# Patient Record
Sex: Male | Born: 1955 | Race: Black or African American | Hispanic: No | Marital: Married | Smoking: Never smoker
Health system: Southern US, Community
[De-identification: ages and names within clinical notes are randomized; demographics above are authoritative.]

## PROBLEM LIST (undated history)

## (undated) DIAGNOSIS — E785 Hyperlipidemia, unspecified: Secondary | ICD-10-CM

## (undated) DIAGNOSIS — I10 Essential (primary) hypertension: Secondary | ICD-10-CM

## (undated) DIAGNOSIS — K2100 Gastro-esophageal reflux disease with esophagitis, without bleeding: Secondary | ICD-10-CM

## (undated) DIAGNOSIS — R002 Palpitations: Secondary | ICD-10-CM

## (undated) DIAGNOSIS — H409 Unspecified glaucoma: Secondary | ICD-10-CM

## (undated) DIAGNOSIS — R0789 Other chest pain: Secondary | ICD-10-CM

## (undated) HISTORY — DX: Hyperlipidemia, unspecified: E78.5

## (undated) HISTORY — DX: Gastro-esophageal reflux disease with esophagitis: K21.0

## (undated) HISTORY — DX: Gastro-esophageal reflux disease with esophagitis, without bleeding: K21.00

## (undated) HISTORY — DX: Palpitations: R00.2

## (undated) HISTORY — DX: Other chest pain: R07.89

## (undated) HISTORY — DX: Unspecified glaucoma: H40.9

---

## 2000-09-28 ENCOUNTER — Observation Stay (HOSPITAL_COMMUNITY): Admission: EM | Admit: 2000-09-28 | Discharge: 2000-09-29 | Payer: Self-pay

## 2000-09-28 ENCOUNTER — Encounter: Payer: Self-pay | Admitting: Emergency Medicine

## 2001-08-24 ENCOUNTER — Encounter: Payer: Self-pay | Admitting: *Deleted

## 2001-08-24 ENCOUNTER — Emergency Department (HOSPITAL_COMMUNITY): Admission: EM | Admit: 2001-08-24 | Discharge: 2001-08-24 | Payer: Self-pay | Admitting: *Deleted

## 2004-08-05 ENCOUNTER — Emergency Department (HOSPITAL_COMMUNITY): Admission: EM | Admit: 2004-08-05 | Discharge: 2004-08-05 | Payer: Self-pay | Admitting: Emergency Medicine

## 2004-09-01 ENCOUNTER — Emergency Department (HOSPITAL_COMMUNITY): Admission: EM | Admit: 2004-09-01 | Discharge: 2004-09-01 | Payer: Self-pay | Admitting: Emergency Medicine

## 2010-01-29 ENCOUNTER — Ambulatory Visit: Payer: Self-pay | Admitting: Cardiology

## 2010-01-29 ENCOUNTER — Observation Stay (HOSPITAL_COMMUNITY)
Admission: EM | Admit: 2010-01-29 | Discharge: 2010-01-31 | Payer: Self-pay | Source: Home / Self Care | Admitting: Emergency Medicine

## 2010-01-30 ENCOUNTER — Encounter: Payer: Self-pay | Admitting: Cardiology

## 2010-01-31 ENCOUNTER — Encounter: Payer: Self-pay | Admitting: Adult Health

## 2010-02-07 ENCOUNTER — Telehealth (INDEPENDENT_AMBULATORY_CARE_PROVIDER_SITE_OTHER): Payer: Self-pay | Admitting: *Deleted

## 2010-02-08 ENCOUNTER — Ambulatory Visit: Payer: Self-pay | Admitting: Cardiovascular Disease

## 2010-02-08 ENCOUNTER — Encounter: Payer: Self-pay | Admitting: Adult Health

## 2010-02-08 DIAGNOSIS — I10 Essential (primary) hypertension: Secondary | ICD-10-CM | POA: Insufficient documentation

## 2010-02-08 DIAGNOSIS — R079 Chest pain, unspecified: Secondary | ICD-10-CM | POA: Insufficient documentation

## 2010-02-15 ENCOUNTER — Encounter (INDEPENDENT_AMBULATORY_CARE_PROVIDER_SITE_OTHER): Payer: Self-pay | Admitting: *Deleted

## 2010-02-15 ENCOUNTER — Ambulatory Visit: Payer: Self-pay | Admitting: Cardiology

## 2010-03-07 ENCOUNTER — Ambulatory Visit
Admission: RE | Admit: 2010-03-07 | Discharge: 2010-03-07 | Payer: Self-pay | Source: Home / Self Care | Attending: Cardiology | Admitting: Cardiology

## 2010-03-07 DIAGNOSIS — F528 Other sexual dysfunction not due to a substance or known physiological condition: Secondary | ICD-10-CM | POA: Insufficient documentation

## 2010-03-25 ENCOUNTER — Telehealth (INDEPENDENT_AMBULATORY_CARE_PROVIDER_SITE_OTHER): Payer: Self-pay

## 2010-04-02 NOTE — Assessment & Plan Note (Signed)
Summary: EPH   Visit Type:  Follow-up Primary [place] [facility]:  Dr.Mcinnis  CC:  post hospital.  History of Present Illness: Christopher Gallegos is a 55 y/o AAM who is here on follow-up after being seen on consultation in [facility] for chest pain and hypertension.  He was placed on lisinopril 20/12.5mg  daily, had an echocardiogram and placed on PPI on discharge. He has had some complaints of headache but no chest discomfort since discharge. Echo demonstrated normal LV fx of 55%-60% and grade 2 diastolic dysfx.  He is compliant with medications.    Current Medications (verified): 1)  Lisinopril-Hydrochlorothiazide 20-12.5 Mg Tabs (Lisinopril-Hydrochlorothiazide) .... Take 1 Tablet By Mouth Once A Day 2)  Pantoprazole Sodium 40 Mg Tbec (Pantoprazole Sodium) .... Take 1 Tab Daily  Allergies (verified): No Known Drug Allergies  Comments:  Nurse/Medical Assistant: patient is only on 2 meds   Past History:  Past medical, surgical, family and social histories (including risk factors) reviewed, and no changes noted (except as noted below).  Past Medical History: Reviewed history from 02/07/2010 and no changes required. atypical chest pain reflux esophagitis hyperlipidemia palpatations  Past Surgical History: Reviewed history from 02/07/2010 and no changes required. no surgical history noted  Family History: Reviewed history from 02/07/2010 and no changes required. Father:deceased due to car accident Mother:aliv has heart issues  Social History: Reviewed history from 02/07/2010 and no changes required. Full Time Married  Tobacco Use - No.  Alcohol Use - no Regular Exercise - no Drug Use - no  Review of Systems       Headaches  All other systems have been reviewed and are negative unless stated above.   Vital Signs:  Patient profile:   56 year old male Height:      65 inches Weight:      195 pounds BMI:     32.57 O2 Sat:      92 % on Room air Pulse rate:   75 / minute BP sitting:    157 / 85  (right arm)  Vitals Entered By: Dreama Saa, CNA (February 08, 2010 2:46 PM)  O2 Flow:  Room air  Physical Exam  General:  Well developed, well nourished, in no acute distress. Lungs:  Clear bilaterally to auscultation and percussion. Heart:  Non-displaced PMI, chest non-tender; regular rate and rhythm, S1, S2 with soft S4, Carotid upstroke normal, no bruit. Normal abdominal aortic size, no bruits. Femorals normal pulses, no bruits. Pedals normal pulses. No edema, no varicosities. Abdomen:  Bowel sounds positive; abdomen soft and non-tender without masses, organomegaly, or hernias noted. No hepatosplenomegaly. Msk:  Back normal, normal gait. Muscle strength and tone normal. Pulses:  pulses normal in all 4 extremities Extremities:  No clubbing or cyanosis. Neurologic:  Alert and oriented x 3. Psych:  Normal affect.   EKG  Procedure date:  02/08/2010  Findings:      Normal sinus rhythm with rate of:  73bpm  Impression & Recommendations:  Problem # 1:  CHEST PAIN UNSPECIFIED (ICD-786.50) He is without complaint at this time.  PPI has been very helpful with his symptoms.  Will consider stress myoview once BP is stable. His updated medication list for this problem includes:    Lisinopril-hydrochlorothiazide 20-12.5 Mg Tabs (Lisinopril-hydrochlorothiazide) .Marland Kitchen... Take 1 tablet by mouth once a day  Problem # 2:  HYPERTENSION (ICD-401.9) Much better controlled than on hospitalization 190's systolic.  I have asked him to get a BP cuff and record his readings at home.  Will probably  have to go up on dose of lisinopril/HCTZ if remains in the 150's systolic with Grade II distolic dysfx, to bring him down to desired level of 135/70.  He will come back in one week with these recordings.  I have given him instructions on low sodium diet. His updated medication list for this problem includes:    Lisinopril-hydrochlorothiazide 20-12.5 Mg Tabs (Lisinopril-hydrochlorothiazide) .Marland Kitchen... Take  1 tablet by mouth once a day  Orders: Durable Medical Equipment (DME)  Patient Instructions: 1)  Your physician recommends that you schedule a follow-up appointment in: 3 MONTHS 2)  Your physician recommends that you continue on your current medications as directed. Please refer to the Current Medication list given to you today. 3)  You have been referred to NURSE VISIT IN 1 WEEK, PLEASE BRING BP DIARY AND CUFF TO VISIT 4)  Your physician has requested that you limit the intake of sodium (salt) in your diet to two grams daily. Please see [facility] handout. 5)  Your physician has requested that you regularly monitor and record your blood pressure readings at home.  Please use the same machine at the same time of day to check your readings and record them to bring to your follow-up visit. 6)  BOOK RECOMMENDATION: EAT THIS NOT THAT Prescriptions: LISINOPRIL-HYDROCHLOROTHIAZIDE 20-12.5 MG TABS (LISINOPRIL-HYDROCHLOROTHIAZIDE) Take 1 tablet by mouth once a day  #30 x 3   Entered by:   Fuller Plan CMA   Authorized by:   Joni Reining, NP   Signed by:   Fuller Plan CMA on 02/08/2010   Method used:   Electronically to        [facility]  [address] (retail)       [address]       [place], [place]  [address]       Ph: [phone]       Fax: [phone]   RxID:   [id number]

## 2010-04-02 NOTE — Miscellaneous (Signed)
Summary: replace benicar with lisinopril/hct for cost  Clinical Lists Changes  Medications: Added new medication of LISINOPRIL-HYDROCHLOROTHIAZIDE 20-12.5 MG TABS (LISINOPRIL-HYDROCHLOROTHIAZIDE) Take 1 tablet by mouth once a day - Signed Rx of LISINOPRIL-HYDROCHLOROTHIAZIDE 20-12.5 MG TABS (LISINOPRIL-HYDROCHLOROTHIAZIDE) Take 1 tablet by mouth once a day;  #30 x 3;  Signed;  Entered by: Teressa Lower RN;  Authorized by: Joni Reining, NP;  Method used: Electronically to [facility] [address] [place], [place], [place]  [address], Ph: [phone], Fax: [phone]    Prescriptions: LISINOPRIL-HYDROCHLOROTHIAZIDE 20-12.5 MG TABS (LISINOPRIL-HYDROCHLOROTHIAZIDE) Take 1 tablet by mouth once a day  #30 x 3   Entered by:   Teressa Lower RN   Authorized by:   Joni Reining, NP   Signed by:   Teressa Lower RN on 01/31/2010   Method used:   Electronically to        [facility]  [address] (retail)       [address]       [place], [place]  [address]       Ph: [phone]       Fax: [phone]   RxID:   [id number]

## 2010-04-02 NOTE — Progress Notes (Signed)
Summary: Medication Concerns  Phone Note Call from Patient Call back at [phone]   Caller: Spouse Reason for Call: Talk to Nurse Summary of Call: patient's wife states that patient is having headaches every other day since starting the "new medication" / tg Initial call taken by: Raechel Ache [facility],  February 07, 2010 11:04 AM  Follow-up for Phone Call        pt to follow up tomorrow for ha Follow-up by: Teressa Lower RN,  February 07, 2010 11:21 AM

## 2010-04-04 NOTE — Letter (Signed)
Summary: Appointment - Missed  [facility] [place]  [address] [place] [address]   Phone: [phone]  Fax: [phone]     February 15, 2010 MRN: [id number]   [facility] [address] [place], [place]  [address]   Dear Mr. Garrelts,  Our records indicate you missed your appointment on     02/15/10 NURSE VISIT                                   It is very important that we reach you to reschedule this appointment. We look forward to participating in your health care needs. Please contact us at the number listed above at your earliest convenience to reschedule this appointment.     Sincerely,    Glass blower/designer

## 2010-04-04 NOTE — Progress Notes (Signed)
Summary: CAN PT INCREASE MEDS BP IS STILL HIGH  Phone Note Call from Patient Call back at Home Phone [phone]   Caller: PT WIFE Reason for Call: Talk to Nurse Summary of Call: PT TOLD TO TAKE LISINOPRIL 40MG  AND HYDROCHLOROTHIAZIDE 25MG . HE WANTS TO KNOW IF HE CAN INCREASE THEM BECAUSE HIS BP IS STILL RUNNING 181/?. Initial call taken by: Faythe Ghee,  March 25, 2010 10:10 AM  Follow-up for Phone Call        S: Pt's wife c/o pt's BP being elevated. B: On last OV with Joni Reining, NP pt. was advised to increase Lisinopril to 40mg  qd and to continue HCTZ 12.5mg  qd. A: Pt's wife states that pt. denies CP, SOB, dizziness,etc. She states that pt. checked his BP over the weekend at [facility] but cant remember what the reading was. Mrs. Aleman is advised to have pt. check his BP as often as possible at any pharmacy and to write readings down.   R: Wife advised that we need BP readings in order to adjust meds and to advise pt. not to change medications until advised by MD. She states she understands instructions given. Follow-up by: Larita Fife Via LPN,  March 25, 2010 11:05 AM     Appended Document: CAN PT INCREASE MEDS BP IS STILL HIGH Have him come back for a BP reading sometime this week.  Make sure he has taken his medications before he comes in.    Joni Reining NP

## 2010-04-04 NOTE — Assessment & Plan Note (Signed)
Summary: PER PT PHONE CALL FOR BP ISSUES/TG   Visit Type:  Follow-up Primary Provider:  Dr.Mcinnis   History of Present Illness: [name] is a 55 y/o AAM we are seeing on follow-up for ongoing treatment of hypertension and chest pain.  He is without complaint today with the exception of ED issues. On last visit BP was still elevated on current medications.  As he was anxious that day, we decided to wait to increase his mediation dose.  He was advised to get a BP home monitor.  He unfortunately has not done so. He remains hypertensive on this visit.  Current Medications (verified): 1)  Lisinopril 40 Mg Tabs (Lisinopril) .... Take 1 Tablet By Mouth Once Daily 2)  Pantoprazole Sodium 40 Mg Tbec (Pantoprazole Sodium) .... Take 1 Tab Daily 3)  Hydrochlorothiazide 25 Mg Tabs (Hydrochlorothiazide) .... Take 1/2 Tablet By Mouth Once Daily 4)  Cialis 20 Mg Tabs (Tadalafil) .... Take 1 Tablet By Mouth 1 Hour Prior To Sexual Activity  Allergies (verified): No Known Drug Allergies  Review of Systems       All other systems have been reviewed and are negative unless stated above.   Vital Signs:  Patient profile:   55 year old male Weight:      190 pounds BMI:     31.73 Pulse rate:   81 / minute BP sitting:   162 / 91  (right arm)  Vitals Entered By: Dreama Saa, CNA (March 07, 2010 3:27 PM)  Physical Exam  General:  Well developed, well nourished, in no acute distress. Lungs:  Clear bilaterally to auscultation and percussion. Heart:  Non-displaced PMI, chest non-tender; regular rate and rhythm, S1, S2 with S 4 murmur.no rubs or gallops. Carotid upstroke normal, no bruit. Normal abdominal aortic size, no bruits. Femorals normal pulses, no bruits. Pedals normal pulses. No edema, no varicosities. Abdomen:  Bowel sounds positive; abdomen soft and non-tender without masses, organomegaly, or hernias noted. No hepatosplenomegaly. Msk:  Back normal, normal gait. Muscle strength and tone  normal. Pulses:  pulses normal in all 4 extremities Extremities:  No clubbing or cyanosis. Neurologic:  Alert and oriented x 3. Psych:  Normal affect.   Impression & Recommendations:  Problem # 1:  HYPERTENSION (ICD-401.9) Hias blood pressure is not well controlled and therefore we will increase lisinopril to 40mg  daily but keep the HCTZ at 12.5.  He says he sometimes feels a little lightheaded in the am and when he goes from sitting to standing.   He is noted to have some mild orthostatic BP change of about 10 points. We will continue to monitor this. Follow-up in a month. His updated medication list for this problem includes:    Lisinopril 40 Mg Tabs (Lisinopril) .Marland Kitchen... Take 1 tablet by mouth once daily    Hydrochlorothiazide 25 Mg Tabs (Hydrochlorothiazide) .Marland Kitchen... Take 1/2 tablet by mouth once daily  Problem # 2:  CHEST PAIN UNSPECIFIED (ICD-786.50) Assessment: Improved PPI has eliminated this. His updated medication list for this problem includes:    Lisinopril 40 Mg Tabs (Lisinopril) .Marland Kitchen... Take 1 tablet by mouth once daily  Problem # 3:  ERECTILE DYSFUNCTION, NON-ORGANIC (ICD-302.72) This may be related to medications, but he states that he had some issues with this prior to starting on the medications when his blood pressure was not controlled. He is given Rx for viagra 50mg  to use as needed. He is not on nitrate.  If this is a persistant problem is is advised to  see a urologist or his family physician.  Patient Instructions: 1)  Your physician recommends that you schedule a follow-up appointment in: 1 month 2)  Your physician has recommended you make the following change in your medication: Stop taking Lisinopril/HCTZ and start taking Lisinopril 40mg  by mouth once daily, Hydrochlorothiazide 12.5 (1/2 of 25mg  tablet) and Cialis 20mg  take 1 tablet 1 hour before sexual activity. Prescriptions: CIALIS 20 MG TABS (TADALAFIL) take 1 tablet by mouth 1 hour prior to sexual activity  #20 x  1   Entered by:   Larita Fife Via LPN   Authorized by:   Joni Reining, NP   Signed by:   Larita Fife Via LPN on 16/12/9602   Method used:   Electronically to        [facility]  [address] (retail)       [address]       [place], [place]  [address]       Ph: [phone]       Fax: [phone]   RxID:   [id number] VIAGRA 50 MG TABS (SILDENAFIL CITRATE) take 1 tablet by mouth 1 hour prior to sexual activity  #20 x 1   Entered by:   Larita Fife Via LPN   Authorized by:   Joni Reining, NP   Signed by:   Larita Fife Via LPN on 12/28/2534   Method used:   Electronically to        [facility]  [address] (retail)       [address]       [place], [place]  [address]       Ph: [phone]       Fax: [phone]   RxID:   [id number] HYDROCHLOROTHIAZIDE 25 MG TABS (HYDROCHLOROTHIAZIDE) take 1/2 tablet by mouth once daily  #16 x 3   Entered by:   Larita Fife Via LPN   Authorized by:   Joni Reining, NP   Signed by:   Larita Fife Via LPN on 32/35/5732   Method used:   Electronically to        [facility]  [address] (retail)       [address]       [place], [place]  [address]       Ph: [phone]       Fax: [phone]   RxID:   [id number] LISINOPRIL 40 MG TABS (LISINOPRIL) take 1 tablet by mouth once daily  #30 x 3   Entered by:   Larita Fife Via LPN   Authorized by:   Joni Reining, NP   Signed by:   Larita Fife Via LPN on 35/00/9381   Method used:   Electronically to        [facility]  [address] (retail)       [address]       [place], [place]  [address]       Ph: [phone]       Fax: [phone]   RxID:   [id number]  Pt. wanted Cialis in place of Viagra. Pharmacy notified. Larita Fife Via LPN  March 07, 2010 4:12 PM

## 2010-04-08 ENCOUNTER — Ambulatory Visit (INDEPENDENT_AMBULATORY_CARE_PROVIDER_SITE_OTHER): Payer: BC Managed Care – PPO | Admitting: Adult Health

## 2010-04-08 ENCOUNTER — Encounter: Payer: Self-pay | Admitting: Adult Health

## 2010-04-08 DIAGNOSIS — I10 Essential (primary) hypertension: Secondary | ICD-10-CM

## 2010-04-18 NOTE — Assessment & Plan Note (Signed)
Summary: PT TO BE HERE BY 3:15/TG/AMD   Visit Type:  Follow-up Primary Provider:  Dr.Mcinnis   History of Present Illness: Christopher Gallegos is a friendly 55 y/o AAM we are following for chest discomfort and uncontrolled hypertension.  He was last seen one month ago and was increased on lisinopril dose to 40mg  daily from 20mg  daily,  He is here for follow-up.  He is hypertensive today, and was running late coming from work.BP was rechecked and found to be 168/86.  He denies chest pain unless he is stressed at work which is accompanied by headache. He brought a BP log with him with BP ranges between 136 systolic to 168 systolic.  Avg 158-160 systolic.  He states he is taking his medications as directed.  He complaina of some palpatations while at work when he is stressed out and under pressure.  Current Medications (verified): 1)  Lisinopril 40 Mg Tabs (Lisinopril) .... Take 1 Tablet By Mouth Once Daily 2)  Pantoprazole Sodium 40 Mg Tbec (Pantoprazole Sodium) .... Take 1 Tab Daily 3)  Hydrochlorothiazide 25 Mg Tabs (Hydrochlorothiazide) .... Take 1/2 Tablet By Mouth Once Daily 4)  Cialis 20 Mg Tabs (Tadalafil) .... Take 1 Tablet By Mouth 1 Hour Prior To Sexual Activity 5)  Norvasc 5 Mg Tabs (Amlodipine Besylate) .... Take 1 Tablet By Mouth At Bedtime  Allergies (verified): No Known Drug Allergies  Comments:  Nurse/Medical Assistant: patient [facility] [place]  Review of Systems       Stress, palpatations, headaches  All other systems have been reviewed and are negative unless stated above.   Vital Signs:  Patient profile:   55 year old male Weight:      192 pounds BMI:     32.07 Pulse rate:   65 / minute BP sitting:   181 / 88  (left arm)  Vitals Entered By: Christopher Saa, CNA (April 08, 2010 3:26 PM)  Physical Exam  General:  Well developed, well nourished, in no acute distress. Lungs:  Clear bilaterally to auscultation and  percussion. Heart:  Non-displaced PMI, chest non-tender; regular rate and rhythm, S1, S2 without murmurs, rubs or gallops. Carotid upstroke normal, no bruit. Normal abdominal aortic size, no bruits. Femorals normal pulses, no bruits. Pedals normal pulses. No edema, no varicosities. No S4.   Impression & Recommendations:  Problem # 1:  HYPERTENSION (ICD-401.9) Grade II diastolic dysfx per echo was noted.  He is not well controlled on current medications.  Will add a third medication Norvasc 5mg  daily which he will take at noontime to avoid cummulative effect with all medications in the am.  He is given instructions on low sodium diet. I am also concerned about sleep apnea contributing to his BP levels.  He admits to snoring and to not sleeping well.  I have discussed the need to evaluate him further concerning this.  He wishes to think about it. I have given him information on sleep apnea to review.  He is also advised to increase his exericise to assist with stress levels and to help with wt loss which will contribute to better BP control. He verbalizes understanding. His updated medication list for this problem includes:    Lisinopril 40 Mg Tabs (Lisinopril) .Marland Kitchen... Take 1 tablet by mouth once daily    Hydrochlorothiazide 25 Mg Tabs (Hydrochlorothiazide) .Marland Kitchen... Take 1/2 tablet by mouth once daily    Norvasc 5 Mg Tabs (Amlodipine besylate) .Marland Kitchen... Take 1 tablet by mouth at bedtime  Problem # 2:  ERECTILE DYSFUNCTION, NON-ORGANIC (ICD-302.72) I have referred him to Dr. Frann Rider, Urlologist for futher treatment and assessment.  He is given on sample box of cialis.  He is not on nitrates and it is therefore safe to try. We will not be treating his ED but has encouraged him to follow up with urology for this. Orders: Misc. Referral (Misc. Ref)  Patient Instructions: 1)  Your physician recommends that you schedule a follow-up appointment in:  1 month for a blood pressure check with nurse and in 3 months with  Provider 2)  Your physician has recommended you make the following change in your medication: Start taking Norvasc 5mg  by mouth once daily 3)  Your physician recommends that you start a low salt diet, please see hand out given to you at today's visit.   4)  Also, see hand out on sleep apnea given to you today as well. Prescriptions: NORVASC 5 MG TABS (AMLODIPINE BESYLATE) take 1 tablet by mouth at bedtime  #30 x 3   Entered by:   Larita Fife Via LPN   Authorized by:   Joni Reining, NP   Signed by:   Larita Fife Via LPN on 16/12/9602   Method used:   Electronically to        [facility]  [address] (retail)       [address]       [place], [place]  [address]       Ph: [phone]       Fax: [phone]   RxID:   [id number] CIALIS 20 MG TABS (TADALAFIL) take 1 tablet by mouth 1 hour prior to sexual activity  #3 x 0   Entered by:   Larita Fife Via LPN   Authorized by:   Joni Reining, NP   Signed by:   Larita Fife Via LPN on 12/28/2534   Method used:   Samples Given   RxID:   [id number]  Lot # =[id number]   Exp. date= 51-8841

## 2010-04-18 NOTE — Letter (Signed)
Summary: Handout Printed  Printed Handout:  - Diet - Sodium-Controlled 

## 2010-04-18 NOTE — Letter (Signed)
Summary: Handout Printed  Printed Handout:  - Sleep Apnea 

## 2010-04-18 NOTE — Letter (Signed)
Summary: Handout Printed  Printed Handout:  - Diet - Seasoning without Salt 

## 2010-05-06 ENCOUNTER — Encounter (INDEPENDENT_AMBULATORY_CARE_PROVIDER_SITE_OTHER): Payer: Self-pay

## 2010-05-06 ENCOUNTER — Ambulatory Visit (INDEPENDENT_AMBULATORY_CARE_PROVIDER_SITE_OTHER): Payer: BC Managed Care – PPO

## 2010-05-06 DIAGNOSIS — I10 Essential (primary) hypertension: Secondary | ICD-10-CM

## 2010-05-06 DIAGNOSIS — R079 Chest pain, unspecified: Secondary | ICD-10-CM

## 2010-05-07 ENCOUNTER — Telehealth (INDEPENDENT_AMBULATORY_CARE_PROVIDER_SITE_OTHER): Payer: Self-pay

## 2010-05-07 ENCOUNTER — Encounter: Payer: Self-pay | Admitting: Adult Health

## 2010-05-13 ENCOUNTER — Ambulatory Visit: Payer: Self-pay | Admitting: Adult Health

## 2010-05-14 LAB — CBC
Platelets: 285 10*3/uL (ref 150–400)
RDW: 12.8 % (ref 11.5–15.5)
WBC: 7.5 10*3/uL (ref 4.0–10.5)

## 2010-05-14 LAB — URINALYSIS, ROUTINE W REFLEX MICROSCOPIC
Bilirubin Urine: NEGATIVE
Glucose, UA: NEGATIVE mg/dL
Hgb urine dipstick: NEGATIVE
Ketones, ur: NEGATIVE mg/dL
Nitrite: NEGATIVE
Protein, ur: NEGATIVE mg/dL
Specific Gravity, Urine: 1.02 (ref 1.005–1.030)
Urobilinogen, UA: 2 mg/dL — ABNORMAL HIGH (ref 0.0–1.0)
pH: 6.5 (ref 5.0–8.0)

## 2010-05-14 LAB — CARDIAC PANEL(CRET KIN+CKTOT+MB+TROPI)
CK, MB: 1.1 ng/mL (ref 0.3–4.0)
CK, MB: 1.3 ng/mL (ref 0.3–4.0)
Total CK: 131 U/L (ref 7–232)
Total CK: 158 U/L (ref 7–232)
Total CK: 168 U/L (ref 7–232)
Troponin I: 0.01 ng/mL (ref 0.00–0.06)
Troponin I: <0.01 ng/mL (ref 0.00–0.06)

## 2010-05-14 LAB — LIPID PANEL
Cholesterol: 197 mg/dL (ref 0–200)
Total CHOL/HDL Ratio: 4.4 RATIO
VLDL: 11 mg/dL (ref 0–40)

## 2010-05-14 LAB — HEPATIC FUNCTION PANEL
Alkaline Phosphatase: 44 U/L (ref 39–117)
Bilirubin, Direct: 0.1 mg/dL (ref 0.0–0.3)
Indirect Bilirubin: 0.5 mg/dL (ref 0.3–0.9)
Total Protein: 6.7 g/dL (ref 6.0–8.3)

## 2010-05-14 LAB — BASIC METABOLIC PANEL
BUN: 11 mg/dL (ref 6–23)
Calcium: 8.9 mg/dL (ref 8.4–10.5)
Creatinine, Ser: 1.08 mg/dL (ref 0.4–1.5)
GFR calc non Af Amer: >60 mL/min (ref >60)

## 2010-05-14 LAB — POCT CARDIAC MARKERS
CKMB, poc: 1 ng/mL (ref 1.0–8.0)
CKMB, poc: <1 ng/mL — ABNORMAL LOW (ref 1.0–8.0)
Myoglobin, poc: 64.2 ng/mL (ref 12–200)

## 2010-05-14 LAB — DIFFERENTIAL
Basophils Absolute: 0.1 10*3/uL (ref 0.0–0.1)
Basophils Relative: 1 % (ref 0–1)
Eosinophils Relative: 2 % (ref 0–5)
Lymphocytes Relative: 37 % (ref 12–46)
Neutro Abs: 3.9 10*3/uL (ref 1.7–7.7)

## 2010-05-14 LAB — D-DIMER, QUANTITATIVE: D-Dimer, Quant: 0.22 ug/mL-FEU (ref 0.00–0.48)

## 2010-05-14 LAB — H. PYLORI ANTIBODY, IGG: H Pylori IgG: 4.7 {ISR} — ABNORMAL HIGH

## 2010-05-14 NOTE — Letter (Signed)
Summary: BP LOG  BP LOG   Imported By: Faythe Ghee 05/07/2010 11:08:34  _____________________________________________________________________  External Attachment:    Type:   Image     Comment:   External Document

## 2010-05-14 NOTE — Progress Notes (Signed)
Summary: WHEN CAN HE WORK 40 HOURS A WEEK?  Phone Note Call from Patient Call back at Home Phone [phone]   Caller: PT Reason for Call: Talk to Nurse Summary of Call: NEEDS TO KNOW WHEN HE CAN GO BACK TO WORKING 40 HOURS A WEEK. Initial call taken by: Faythe Ghee,  May 07, 2010 11:44 AM  Follow-up for Phone Call        Pt's wife advised to contact his primary care MD for note allowing him to work only 40 hrs. per week as he has no cardiac complaints at this time. Follow-up by: Larita Fife Via LPN,  May 07, 2010 2:27 PM

## 2010-05-14 NOTE — Assessment & Plan Note (Signed)
**Note De-Identified [facility]** Summary: 1 mth bp check per checkout on 04/08/10/tg   Primary Provider:  Dr.Mcinnis   History of Present Illness: S: Pt. arrives in office for a 1 month BP check with nurse. B: On last OV with Harriet Pho, NP pt. was advised to starting taking Norvasc 5mg  by mouth once daily, to monitor BP daily and bring BP diary to this nurse visit. A: Pt. c/o dizziness and CP, EKG and orthostatic BP's obtained and recorded in chart. His BP today is 150/90 and at last OV BP was 181/88. Pt. did bring in his medications (he has all of them and states he is taking as directed) and BP diary (scanned into chart). Pt. wants to know if you will allow a letter to be written to his place of employment stating that he cannot work anymore than 40 hrs. per week due to dizziness, CP, fatigue and weakness. Please advise. R: Pt. advised that we will contact him with K.Lawrence, NP's recommendations.   Current Medications (verified): 1)  Lisinopril 40 Mg Tabs (Lisinopril) .... Take 1 Tablet By Mouth Once Daily 2)  Pantoprazole Sodium 40 Mg Tbec (Pantoprazole Sodium) .... Take 1 Tab Daily 3)  Hydrochlorothiazide 25 Mg Tabs (Hydrochlorothiazide) .... Take 1/2 Tablet By Mouth Once Daily 4)  Cialis 20 Mg Tabs (Tadalafil) .... Take 1 Tablet By Mouth 1 Hour Prior To Sexual Activity 5)  Norvasc 5 Mg Tabs (Amlodipine Besylate) .... Take 1 Tablet By Mouth At Bedtime  Allergies (verified): No Known Drug Allergies  Vital Signs:  Patient profile:   55 year old male Weight:      188 pounds O2 Sat:      98 % on Room air Pulse rate:   70 / minute Pulse (ortho):   70 / minute BP sitting:   150 / 90  (left arm) BP standing:   155 / 88  Vitals Entered ByLarita Fife Kealii Thueson LPN (May 05, 4740 4:07 PM)  O2 Flow:  Room air  Serial Vital Signs/Assessments:  Time      Position  BP       Pulse  Resp  Temp     By 4:28 PM   Lying LA  149/83   70                    Lynn Arturo Sofranko LPN 5:95 PM   Sitting   156/88   72                    Lynn Juanita Devincent  LPN 6:38 PM   Standing  155/88   70                    Lynn Dovie Kapusta LPN  Comments: no s/s By: Larita Fife Basir Niven LPN   Patient may not have a work excuse.  He is to talk with his primary for that. BP is well controlled.  Keep taking medications as directed.  Joni Reining NP  Prescriptions: PANTOPRAZOLE SODIUM 40 MG TBEC (PANTOPRAZOLE SODIUM) take 1 tab daily  #30 x 3   Entered by:   Larita Fife Lynnley Doddridge LPN   Authorized by:   Joni Reining, NP   Signed by:   Larita Fife Palmyra Rogacki LPN on 75/64/3329   Method used:   Electronically to        [facility]  [address] (retail)       [address]       [place] **Note De-Identified [facility]** [place], [place]  [address]       Ph: [phone]       Fax: [phone]   RxID:   [id number] HYDROCHLOROTHIAZIDE 25 MG TABS (HYDROCHLOROTHIAZIDE) take 1/2 tablet by mouth once daily  #16 x 3   Entered by:   Larita Fife Abrar Koone LPN   Authorized by:   Joni Reining, NP   Signed by:   Larita Fife Ademide Schaberg LPN on 84/13/2440   Method used:   Electronically to        [facility]  [address] (retail)       [address]       [place], [place]  [address]       Ph: [phone]       Fax: [phone]   RxID:   [id number]   Appended Document: 1 mth bp check per checkout on 04/08/10/tg    Phone Note Outgoing Call   Call placed by: Larita Fife Alyanna Stoermer LPN,  May 07, 2010 2:48 PM Summary of Call: Pt's wife advised. She states she understands info. given. Initial call taken by: Larita Fife Mackensi Mahadeo LPN,  May 07, 2010 2:48 PM

## 2010-07-19 NOTE — Discharge Summary (Signed)
[place]  Patient:    Christopher Gallegos, Christopher Gallegos                     MRN: [id number] Adm. Date:  [id number] Disc. Date: [id number] Attending:  Virgina Evener Dictator:   Halford Decamp Delanna Ahmadi, R.N., N.P. CC:         Dr. Renard Matter in [place], [place]   Discharge Summary  HOSPITAL COURSE:  Christopher Gallegos was transferred from [facility] emergency room with complaints of recent exertional associated shortness of breath, diaphoresis, and chest pain.  He also complained about heart fluttering when he works hard or does strenuous activity or feels anxious.  He denied any frank chest pain or pressure and no shortness of breath.  He was admitted for observation overnight.  He was seen by Dr. Nicki Guadalajara.  He was not put on IV heparin.  His labs were all within normal limits.  He had no further complaints of chest pain or palpitations.  On the monitor he remained in sinus bradycardia.  He was given some Toprol.  His blood pressure on admission was somewhat borderline - 150/82.  He states that is does not run high at home. The morning of September 29, 2000 it was 134/78, his heart rate was 58.  His room air saturations were 99%.  He was considered stable to be discharged home.  He was again seen by Dr. Tresa Endo who decided that he should discontinue caffeine which was thought to be the cause of his palpitations.  We will check a 2-D echo as an outpatient secondary to a murmur that was heard, and he was given some Ativan p.r.n. for anxiety.  His EKG was without any ischemic changes.  LABORATORY DATA:  Hemoglobin 12.8, hematocrit 38.5, WBC 7.2, platelets 241. Sodium 137, potassium 3.9, glucose 105, BUN 12, creatinine 1.2, albumin 3.7, SGOT and SGPT were within normal limits.  His CK-MB #1 at [facility] was 178/3.1 with a troponin of 0.03; #2 150/1.9 with a troponin of 0.01.  I do not see a chest x-ray report in the computer at this time.  DISCHARGE MEDICATIONS: 1. Aspirin 81 mg  once per day. 2. Ativan 0.25 mg one-half of a 0.5 mg tablet when needed up to three times a    day for anxiety - he is to use only for extreme anxiety. 3. Metoprolol 25 mg one-half of a 50 mg tablet when needed up to twice per day    for prolonged palpitations, which are palpitations lasting greater than    one hour.  SPECIAL INSTRUCTIONS:  He should stop taking/drinking/ingesting any caffeine.  FOLLOW-UP:  He will undergo a 2-D echo on July 31 and then he will follow up with Dr. Tresa Endo on August 15.  DISCHARGE DIAGNOSES: 1. Palpitations thought secondary to caffeine consumption. 2. Exertional anginal with extreme activity or anxiousness. 3. Murmur. 4. Prior history of hyperlipidemia, diet controlled. DD:  09/29/00 TD:  09/29/00 Job: [id number]

## 2010-07-19 NOTE — H&P (Signed)
[place]  Patient:    Christopher Gallegos, Christopher Gallegos                      MRN: [id number] Adm. Date:  09/28/00 Attending:  Lennette Bihari, M.D. Dictator:   Marya Fossa, P.A. CC:         Butch Penny, M.D. in [place]   History and Physical  DATE OF BIRTH:  Aug 23, 1955  ADMISSION DIAGNOSES: 1. Exertional palpitation/chest pain. 2. Hyperlipidemia.  CHIEF COMPLAINT:  Exertional palpitations and chest pain intermittently x 2 weeks.  HISTORY OF PRESENT ILLNESS:  This is a 55 year old black male without prior cardiac history.  He has diet controlled hyperlipidemia, borderline hypertension, and no history of tobacco use.  He was evaluated at the [facility] Emergency Room today secondary to a two week history of "exertional angina" with associated shortness of breath and diaphoresis per Dr. Einar Gip. However, in discussions with the patient, he states that his heart flutters when he works hard or does strenuous activity or feels anxious, but he denies frank chest pain or pressure, and no shortness of breath.  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  He denies syncope or lightheadedness with this fluttering.  The episodes lasts a couple of minutes, and resolve with rest.  He states he has had a stress test and Sheliah Hatch monitor at least a year ago by Dr. Abbe Amsterdam, and was reportedly normal.  Today while at work, the patients heart began fluttering for about 3 or 4 minutes.  He left work early because he did not feel well.  When he got home he felt better.  His girlfriend convinced him to go to the emergency room because he has had mild quick chest pain associated with these symptoms.  He denies drug use, alcohol use, tobacco abuse.  He does drink 2 to 3 cans of caffeone a day.  ALLERGIES:  No known drug allergies.  CURRENT MEDICATIONS:  None.  PAST MEDICAL  HISTORY:  Hyperlipidemia, last checked greater than one year ago.  No hypertension or diabetes.  PAST SURGICAL HISTORY:  None.  FAMILY HISTORY:  Mother is alive and well, has hypertension.  Father deceased at age 24 in an accident.  Four brothers, three sisters alive and well.  SOCIAL HISTORY:  The patient is divorced, lives with his girlfriend, no children.  Employed by [employer].  REVIEW OF SYSTEMS:  See HPI.  Denies constitutional symptoms.  Denies GU symptoms.  Denies GI symptoms.  PHYSICAL EXAMINATION:  VITAL SIGNS:  Blood pressure 150/82, pulse 50 to 60, respiratory rate 20, FIO2 100% on room air.  GENERAL:  The patient is alert and oriented x 3, in no acute distress.  HEENT:  Normocephalic, atraumatic.  Pupils are equal, round and reactive to light.  Extraocular movements intact.  Nares patent.  Pharynx benign.  NECK:  Supple without bruits or masses.  LUNGS:  Clear to auscultation.  CARDIAC:  Regular rate and rhythm, split S1 (possible S3) with faint 1/6 systolic murmur at the right sternal border.  ABDOMEN:  Soft, nontender, nondistended, normoactive bowel sounds x 4 quadrants.  No extra sounds, no bruits.  EXTREMITIES:  There are 2+ femoral pulses, 2+ pedal pulses, no edema, no bruits.  NEUROLOGIC:  No gross or focal deficits noted.  LABORATORY DATA:  EKG shows normal sinus rhythm, nonspecific ST-T wave abnormality.  Labs show a hemoglobin of 12.8, platelets 241, INR 1.1, potassium 3.9, BUN 12, and creatinine 1.2.  LFTs were within normal limits. CK 178, MB 3.1, troponin 0.03.  IMPRESSION:  Atypical chest pain with palpitations.  The patient has a murmur on examination.  We have recommended admit for observation and rule out myocardial infarction.  If enzymes are negative and pain-free, we will discharge the patient home in the morning and schedule an outpatient stress Cardiolite and 2-D echocardiogram.  We will check a fasting lipid profile.  He will  be seen by Dr. Tresa Endo this evening. DD:  09/28/00 TD:  09/28/00 Job: [id number]

## 2011-02-12 ENCOUNTER — Encounter: Payer: Self-pay | Admitting: Cardiology

## 2011-05-11 ENCOUNTER — Emergency Department (HOSPITAL_COMMUNITY): Payer: BC Managed Care – PPO

## 2011-05-11 ENCOUNTER — Emergency Department (HOSPITAL_COMMUNITY)
Admission: EM | Admit: 2011-05-11 | Discharge: 2011-05-11 | Disposition: A | Discharge status: Home / Self Care | Payer: BC Managed Care – PPO | Attending: Emergency Medicine | Admitting: Emergency Medicine

## 2011-05-11 ENCOUNTER — Encounter (HOSPITAL_COMMUNITY): Payer: Self-pay | Admitting: *Deleted

## 2011-05-11 DIAGNOSIS — R55 Syncope and collapse: Secondary | ICD-10-CM | POA: Insufficient documentation

## 2011-05-11 DIAGNOSIS — R42 Dizziness and giddiness: Secondary | ICD-10-CM | POA: Insufficient documentation

## 2011-05-11 DIAGNOSIS — I10 Essential (primary) hypertension: Secondary | ICD-10-CM | POA: Insufficient documentation

## 2011-05-11 DIAGNOSIS — E785 Hyperlipidemia, unspecified: Secondary | ICD-10-CM | POA: Insufficient documentation

## 2011-05-11 DIAGNOSIS — R059 Cough, unspecified: Secondary | ICD-10-CM | POA: Insufficient documentation

## 2011-05-11 DIAGNOSIS — R05 Cough: Secondary | ICD-10-CM | POA: Insufficient documentation

## 2011-05-11 DIAGNOSIS — Z79899 Other long term (current) drug therapy: Secondary | ICD-10-CM | POA: Insufficient documentation

## 2011-05-11 HISTORY — DX: Essential (primary) hypertension: I10

## 2011-05-11 NOTE — ED Notes (Signed)
Pt ambulating in hallway with steady gate to radiology.

## 2011-05-11 NOTE — ED Provider Notes (Signed)
History     CSN: [id number]  Arrival date & time 05/11/11  1114   First MD Initiated Contact with Patient 05/11/11 1304      Chief Complaint  Patient presents with  . Dizziness  . URI    (Consider location/radiation/quality/duration/timing/severity/associated sxs/prior treatment) HPI Christopher Gallegos is a 56 y.o. male who presents to the Emergency Department complaining of episode of syncope after taking coricidin cough medicine this morning. He has been treating a cough he has had for two weeks. Took the medicine and sat down to eat his breakfast. Felt dizzy and passed out.  Denies headache, vision changes, hearing changes, trouble talking or swallowing, nausea, vomiting, chest pain, shortness of breath. Since the episode has been at his basline.   PCP Dr. Sudie Bailey    Past Medical History  Diagnosis Date  . Atypical chest pain   . Reflux esophagitis   . Hyperlipidemia   . Palpitation   . Hypertension     History reviewed. No pertinent past surgical history.  Family History  Problem Relation Age of Onset  . Heart disease Mother     History  Substance Use Topics  . Smoking status: Never Smoker   . Smokeless tobacco: Not on file  . Alcohol Use: No      Review of Systems A 10 review of systems reviewed and are negative for acute change except as noted in the HPI. Allergies  Review of patient's allergies indicates no known allergies.  Home Medications   Current Outpatient Rx  Name Route Sig Dispense Refill  . AMLODIPINE BESYLATE 10 MG PO TABS Oral Take 10 mg by mouth at bedtime.    Marland Kitchen DEXTROMETHORPHAN-GUAIFENESIN 10-200 MG PO CAPS Oral Take 1 capsule by mouth as needed. For cold symptoms    . HYPROMELLOSE 2.5 % OP SOLN Both Eyes Place 1 drop into both eyes 3 (three) times daily as needed. For dry eyes    . IBUPROFEN 200 MG PO TABS Oral Take 400 mg by mouth every 6 (six) hours as needed. For pain/fever    . LISINOPRIL 40 MG PO TABS Oral Take 40 mg by mouth  daily.      Marland Kitchen HYDROCHLOROTHIAZIDE 25 MG PO TABS Oral Take 12.5 mg by mouth daily.      Marland Kitchen PANTOPRAZOLE SODIUM 40 MG PO TBEC Oral Take 40 mg by mouth daily.      Marland Kitchen TADALAFIL 20 MG PO TABS Oral Take 20 mg by mouth daily as needed. For activity      BP 125/73  Pulse 73  Temp(Src) 98.4 F (36.9 C) (Oral)  Resp 18  Ht 5\' 8"  (1.727 m)  Wt 197 lb (89.359 kg)  BMI 29.95 kg/m2  SpO2 98%  Physical Exam Physical examination:  Nursing notes reviewed; Vital signs and O2 SAT reviewed;  Constitutional: Well developed, Well nourished, Well hydrated, In no acute distress; Head:  Normocephalic, atraumatic; Eyes: EOMI, PERRL, No scleral icterus; ENMT: Mouth and pharynx normal, Mucous membranes moist; Neck: Supple, Full range of motion, No lymphadenopathy; Cardiovascular: Regular rate and rhythm, No murmur, rub, or gallop; Respiratory: Breath sounds clear & equal bilaterally, No rales, rhonchi, wheezes, or rub, Normal respiratory effort/excursion; Chest: Nontender, Movement normal; Abdomen: Soft, Nontender, Nondistended, Normal bowel sounds; Genitourinary: No CVA tenderness; Extremities: Pulses normal, No tenderness, No edema, No calf edema or asymmetry.; Neuro: AA&Ox3, Major CN grossly intact.  No gross focal motor or sensory deficits in extremities.; Skin: Color normal, Warm, Dry  ED Course  Procedures (  including critical care time)  Labs Reviewed - No data to display Dg Chest 2 View  05/11/2011  *RADIOLOGY REPORT*  Clinical Data: Cough and fever.  CHEST - 2 VIEW  Comparison: 01/29/2010.  Findings: The cardiac silhouette, mediastinal and hilar contours are within normal limits and stable. The lungs are clear.  No pleural effusions.  The bony thorax intact  IMPRESSION: No acute cardiopulmonary findings.  No change since prior study.  Original Report Authenticated By: P. Loralie Champagne, M.D.    MDM  Patient here with a syncopal episode after taking cough medicine earlier.Has been alert and oriented since  arrival. Orthostatics were negative. He ambulated to radiology without dizziness. He feels he is at baseline. Pt stable in ED with no significant deterioration in condition.The patient appears reasonably screened and/or stabilized for discharge and I doubt any other medical condition or other [facility] requiring further screening, evaluation, or treatment in the ED at this time prior to discharge.  MDM Reviewed: nursing note and vitals Interpretation: x-ray           Nicoletta Dress. Colon Branch, MD 05/11/11 1501

## 2011-05-11 NOTE — Discharge Instructions (Signed)
Do not take any more of the cough medicine. If you should have any further episodes of fainting, return to the ER or see Dr. Sudie Bailey.    Near-Syncope Near-syncope is sudden weakness, dizziness, or feeling like you might pass out (faint). This may occur when getting up after sitting or while standing for a long period of time. Near-syncope can be caused by a drop in blood pressure. This is a common reaction, but it may occur to a greater degree in people taking medicines to control their blood pressure. Fainting often occurs when the blood pressure or pulse is too low to provide enough blood flow to the brain to keep you conscious. Fainting and near-syncope are not usually due to serious medical problems. However, certain people should be more cautious in the event of near-syncope, including elderly patients, patients with diabetes, and patients with a history of heart conditions (especially irregular rhythms).  CAUSES   Drop in blood pressure.   Physical pain.   Dehydration.   Heat exhaustion.   Emotional distress.   Low blood sugar.   Internal bleeding.   Heart and circulatory problems.   Infections.  SYMPTOMS   Dizziness.   Feeling sick to your stomach (nauseous).   Nearly fainting.   Body numbness.   Turning pale.   Tunnel vision.   Weakness.  HOME CARE INSTRUCTIONS   Lie down right away if you start feeling like you might faint. Breathe deeply and steadily. Wait until all the symptoms have passed. Most of these episodes last only a few minutes. You may feel tired for several hours.   Drink enough fluids to keep your urine clear or pale yellow.   If you are taking blood pressure or heart medicine, get up slowly, taking several minutes to sit and then stand. This can reduce dizziness that is caused by a drop in blood pressure.  SEEK IMMEDIATE MEDICAL CARE IF:   You have a severe headache.   Unusual pain develops in the chest, abdomen, or back.   There is  bleeding from the mouth or rectum, or you have black or tarry stool.   An irregular heartbeat or a very rapid pulse develops.   You have repeated fainting or seizure-like jerking during an episode.   You faint when sitting or lying down.   You develop confusion.   You have difficulty walking.   Severe weakness develops.   Vision problems develop.  MAKE SURE YOU:   Understand these instructions.   Will watch your condition.   Will get help right away if you are not doing well or get worse.  Document Released: 02/17/2005 Document Revised: 02/06/2011 Document Reviewed: 04/05/2010 [facility] Patient Information 2012 [place], [place].

## 2011-05-11 NOTE — ED Notes (Signed)
Pt presents with cough, chest congestion, and dizziness. Pt able to ambulate with steady gate. NAD at this time. Lung sounds clear. No cough present at this time. Wife at bedside. Stretcher in low locked position. Side rail up for pt safety. Call light within reach. Education on plan of care provided. Pt and wife verbalized understanding. Awaiting Md evaluation and orders. Will continue to monitor.

## 2011-05-11 NOTE — ED Notes (Signed)
Pt a/ox4. Resp even and unlabored. NAD at this time. D/C instructions reviewed with pt. Pt verbalized understanding. Pt ambulated to lobby with steady gate.  

## 2011-05-11 NOTE — ED Notes (Signed)
Pt's family states pt became dizzy and had a near syncope episode after taking cold medication this morning.

## 2011-08-14 ENCOUNTER — Encounter: Payer: Self-pay | Admitting: Adult Health

## 2013-09-14 ENCOUNTER — Encounter: Payer: Self-pay | Admitting: *Deleted

## 2013-10-24 ENCOUNTER — Ambulatory Visit: Payer: BC Managed Care – PPO | Admitting: Gastroenterology

## 2014-03-22 ENCOUNTER — Encounter (INDEPENDENT_AMBULATORY_CARE_PROVIDER_SITE_OTHER): Payer: Self-pay | Admitting: *Deleted

## 2014-04-25 ENCOUNTER — Ambulatory Visit (INDEPENDENT_AMBULATORY_CARE_PROVIDER_SITE_OTHER): Payer: Self-pay | Admitting: Internal Medicine

## 2014-05-08 ENCOUNTER — Encounter (INDEPENDENT_AMBULATORY_CARE_PROVIDER_SITE_OTHER): Payer: Self-pay | Admitting: *Deleted

## 2014-05-23 ENCOUNTER — Encounter (INDEPENDENT_AMBULATORY_CARE_PROVIDER_SITE_OTHER): Payer: Self-pay | Admitting: *Deleted

## 2014-05-23 ENCOUNTER — Telehealth (INDEPENDENT_AMBULATORY_CARE_PROVIDER_SITE_OTHER): Payer: Self-pay | Admitting: *Deleted

## 2014-05-23 NOTE — Telephone Encounter (Signed)
Christopher BackersHarry NO SHOWED for his apt with Dorene Arerri Setzer, NP on 04/25/14. A NS letter has been mailed.

## 2014-09-23 ENCOUNTER — Ambulatory Visit (HOSPITAL_COMMUNITY)
Admission: RE | Admit: 2014-09-23 | Discharge: 2014-09-23 | Disposition: A | Discharge status: Home / Self Care | Payer: PRIVATE HEALTH INSURANCE | Source: Ambulatory Visit | Attending: Family Medicine | Admitting: Family Medicine

## 2014-09-23 ENCOUNTER — Other Ambulatory Visit (HOSPITAL_COMMUNITY): Payer: Self-pay | Admitting: Family Medicine

## 2014-09-23 DIAGNOSIS — M25532 Pain in left wrist: Secondary | ICD-10-CM | POA: Diagnosis present

## 2014-09-23 DIAGNOSIS — M25432 Effusion, left wrist: Secondary | ICD-10-CM | POA: Diagnosis not present

## 2017-01-08 IMAGING — DX DG WRIST COMPLETE 3+V*L*
4 series · 4 of 4 positions shown · non-contrast
Comparison: None.

CLINICAL DATA: Chronic wrist pain, left distal radius pain and
swelling.

EXAM:
LEFT WRIST - COMPLETE 3+ VIEW

[wrist pa]
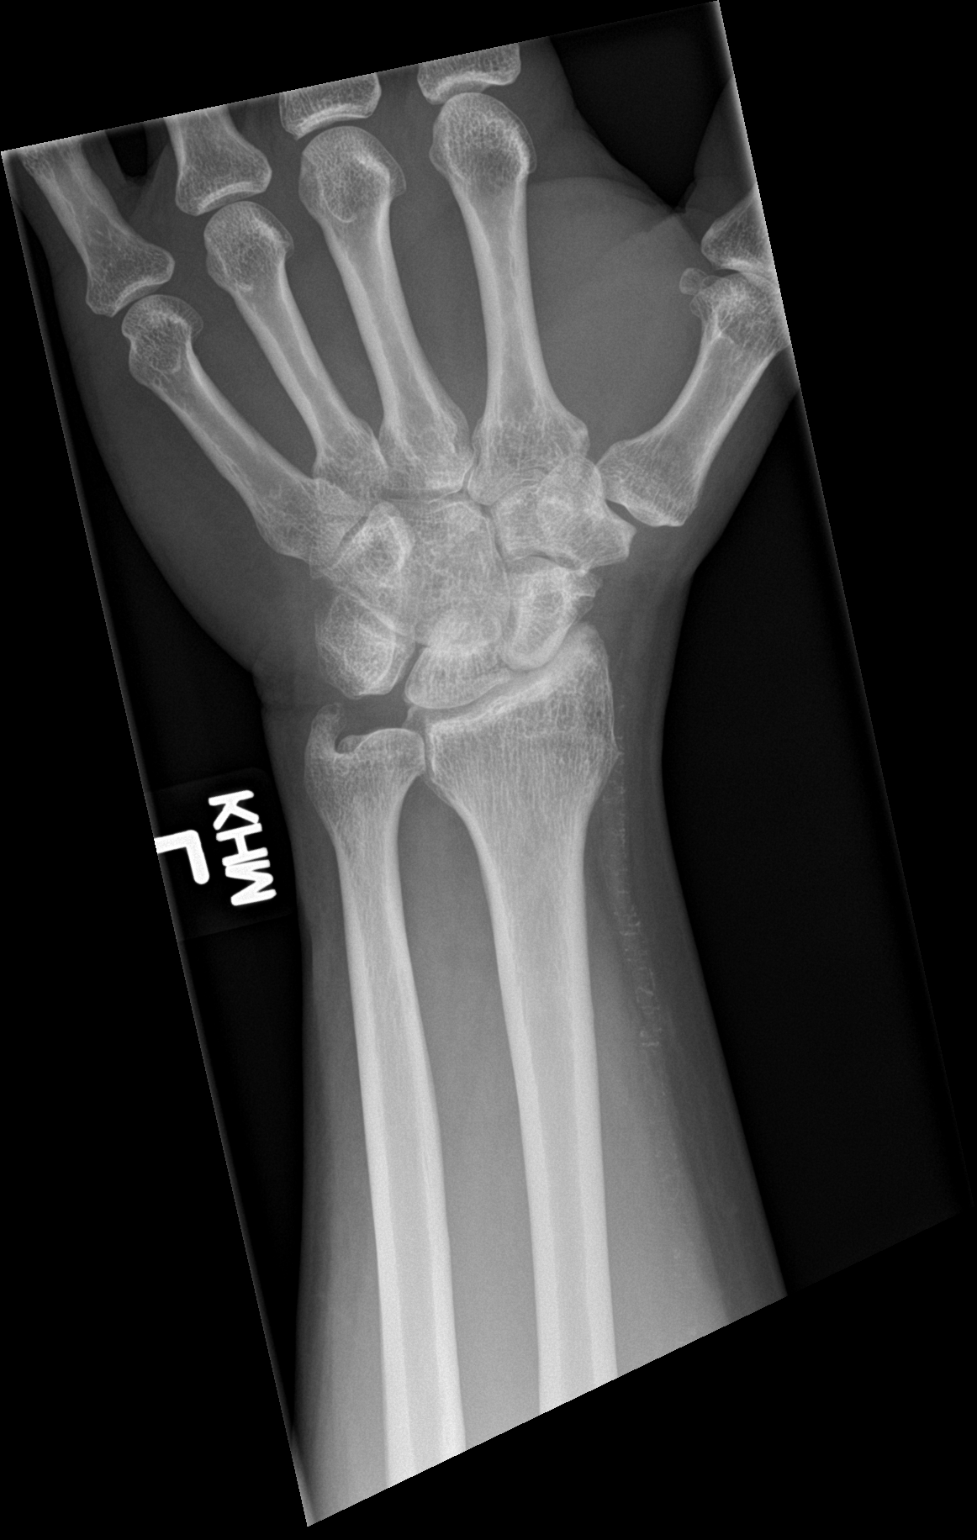

[wrist obl]
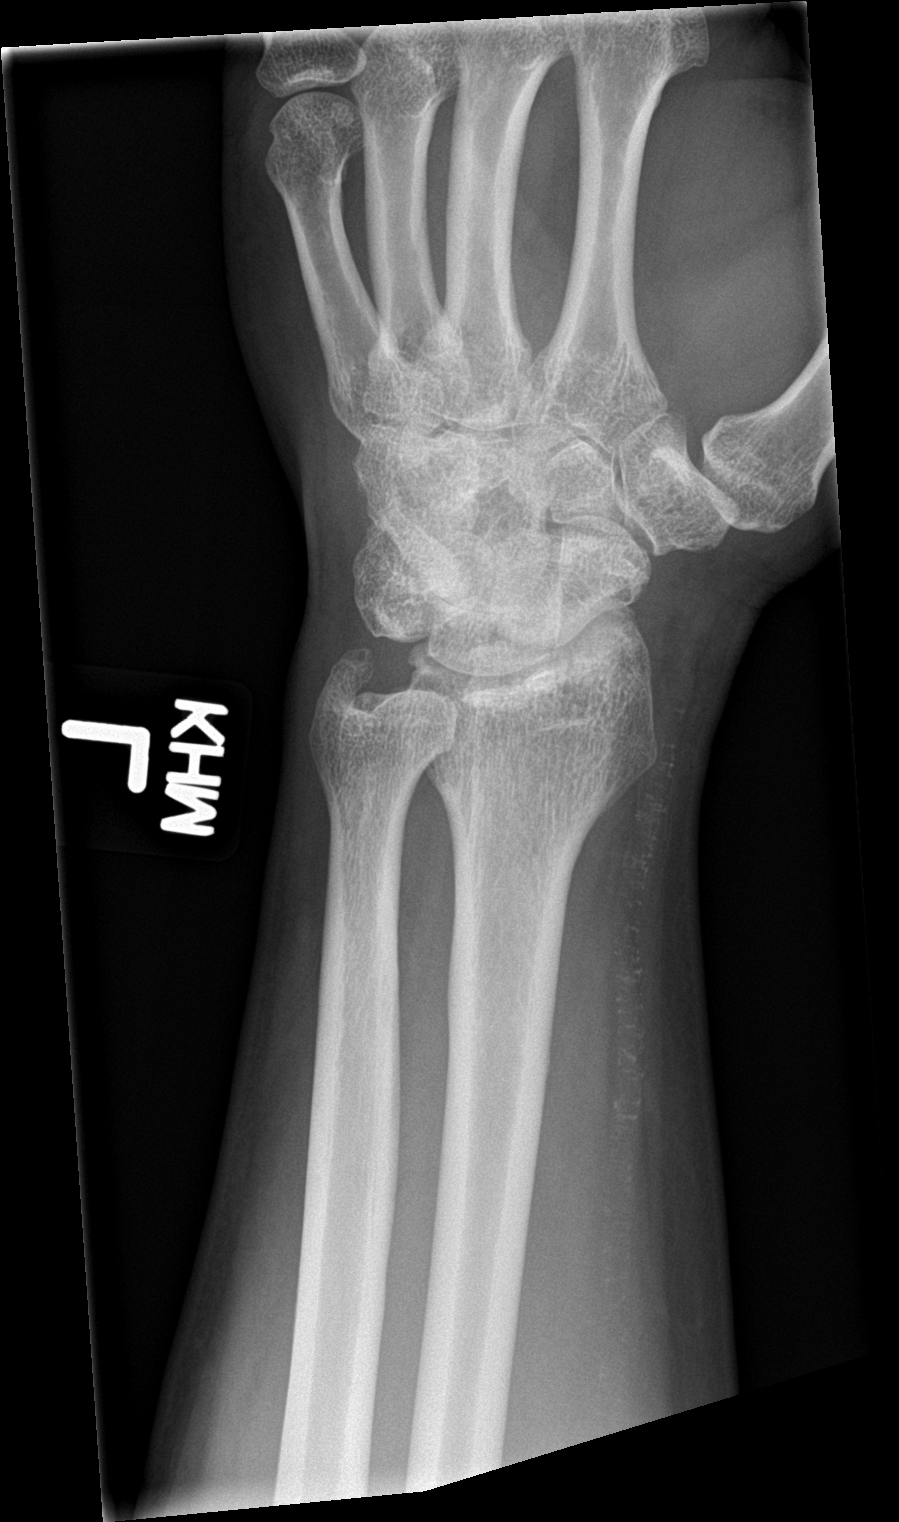

[wrist lat]
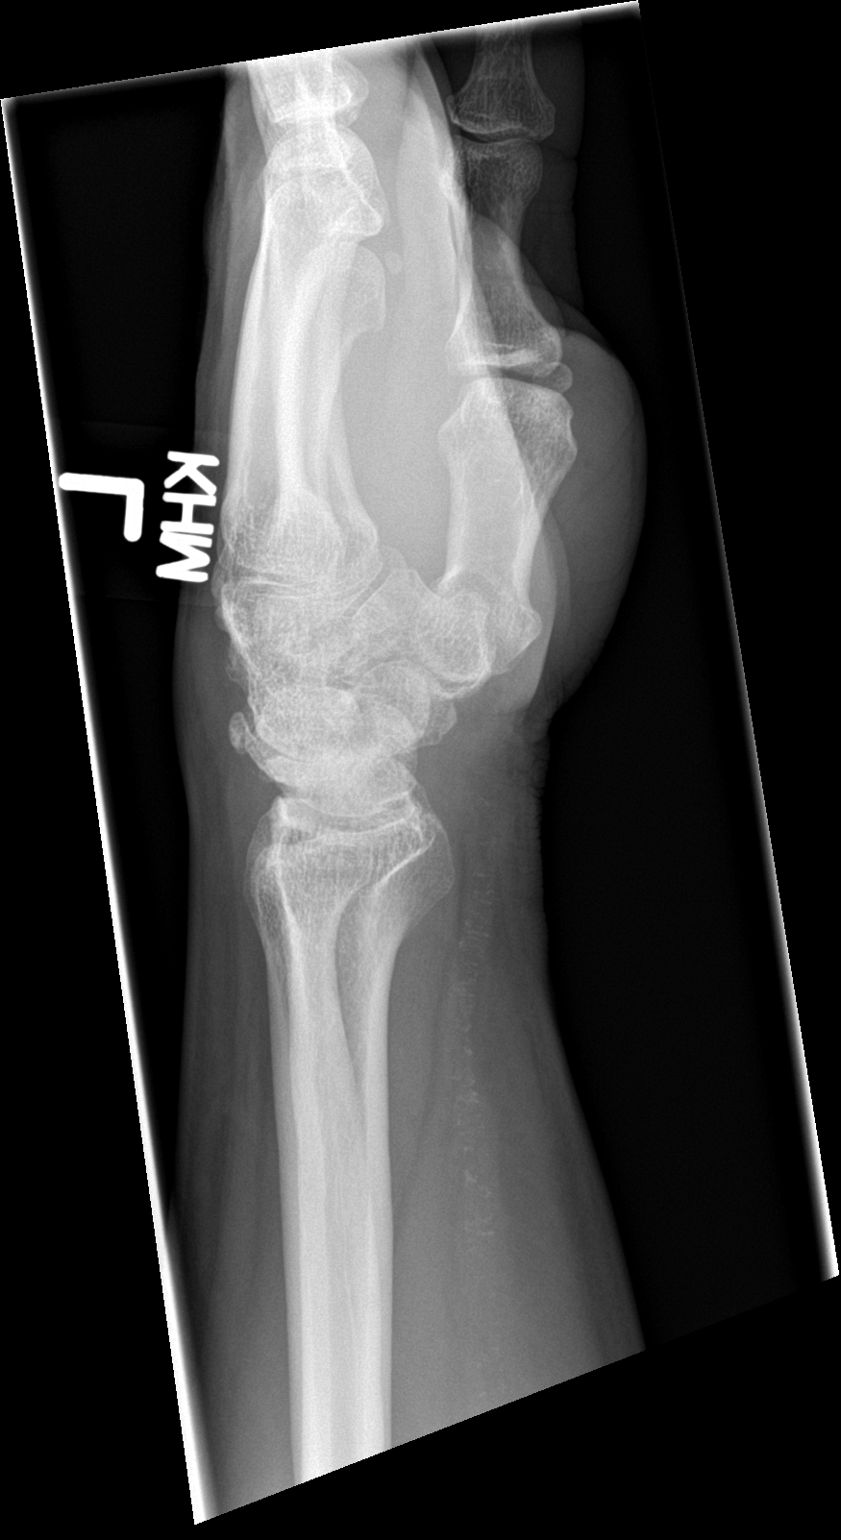

[wrist navicular]
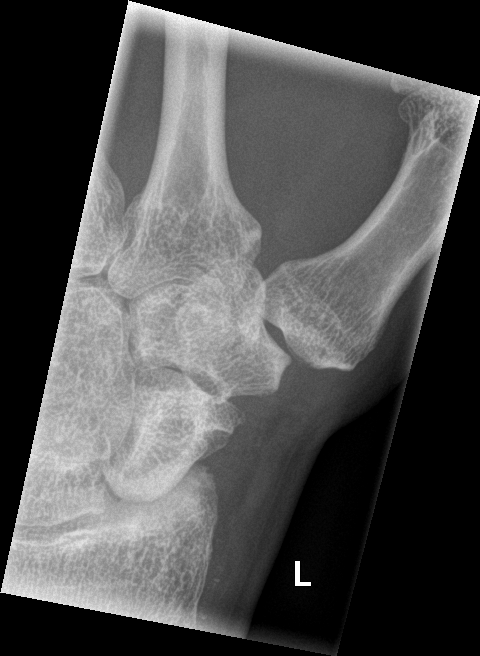

[4 of 4 positions shown; findings below may reference images not displayed]

FINDINGS: Peripheral atherosclerosis of the radial artery. Degenerative
changes of the radiocarpal joint with sclerosis, bony spurring and
joint space loss. Normal alignment without acute osseous finding or
fracture. No definite soft tissue abnormality.
IMPRESSION: Left wrist degenerative changes most pronounced of the radiocarpal
joint.

Peripheral atherosclerosis

No acute osseous finding.

## 2018-02-02 ENCOUNTER — Ambulatory Visit: Payer: PRIVATE HEALTH INSURANCE

## 2020-12-05 DIAGNOSIS — L309 Dermatitis, unspecified: Secondary | ICD-10-CM | POA: Diagnosis not present

## 2020-12-07 ENCOUNTER — Encounter (HOSPITAL_COMMUNITY): Payer: Self-pay | Admitting: *Deleted

## 2020-12-07 ENCOUNTER — Emergency Department (HOSPITAL_COMMUNITY)
Admission: EM | Admit: 2020-12-07 | Discharge: 2020-12-07 | Disposition: A | Discharge status: Home / Self Care | Payer: BC Managed Care – PPO | Attending: Emergency Medicine | Admitting: Emergency Medicine

## 2020-12-07 ENCOUNTER — Other Ambulatory Visit: Payer: Self-pay

## 2020-12-07 DIAGNOSIS — R21 Rash and other nonspecific skin eruption: Secondary | ICD-10-CM | POA: Insufficient documentation

## 2020-12-07 DIAGNOSIS — Z79899 Other long term (current) drug therapy: Secondary | ICD-10-CM | POA: Diagnosis not present

## 2020-12-07 DIAGNOSIS — I10 Essential (primary) hypertension: Secondary | ICD-10-CM | POA: Insufficient documentation

## 2020-12-07 MED ORDER — PREDNISONE 20 MG PO TABS: 40.0000 mg | ORAL_TABLET | Freq: Every day | ORAL | 0 refills | Status: DC

## 2020-12-07 NOTE — ED Triage Notes (Signed)
Pt c/o rash to arms, chest, back, legs x 2 weeks. Pt saw Dr. Michelle Nasuti PA on Wednesday and was given prescription for a cream but he hasn't filled it yet. Pt reports the rash is itchy, but not painful.

## 2020-12-07 NOTE — Discharge Instructions (Signed)
You were seen in the emergency department for evaluation of an itchy rash.  It is not clear what is causing her rash.  We are treating you with a course of some steroids.  You can also apply some moisturizer cream.  Benadryl for itching.  Follow-up with your primary care doctor.  Return to the emergency department if any worsening or concerning symptoms

## 2020-12-07 NOTE — ED Provider Notes (Signed)
[facility] EMERGENCY DEPARTMENT Provider Note   CSN: [id number] Arrival date & time: 12/07/20  0740     History Chief Complaint  Patient presents with   Rash    Christopher Gallegos is a 65 y.o. male.  He is here with a rash that is been spreading over his arms and trunk for the past 2 or 3 weeks.  It is itchy but not painful.  Has not been draining.  No fever.  No obvious exposures.  Has tried nothing for it.  Saw a PA at his PCPs office and prescribed a cream but he has not gotten it filled yet.  The history is provided by the patient.  Rash Location:  Shoulder/arm and torso Shoulder/arm rash location:  L arm and R arm Torso rash location:  L chest, R chest and upper back Quality: itchiness   Quality: not blistering, not draining, not painful and not weeping   Severity:  Moderate Onset quality:  Gradual Duration:  3 weeks Timing:  Constant Progression:  Worsening Chronicity:  New Context: not animal contact and not plant contact   Relieved by:  None tried Worsened by:  Nothing Ineffective treatments:  None tried Associated symptoms: no abdominal pain, no fever, no headaches, no shortness of breath, no sore throat, no throat swelling, no tongue swelling and not vomiting       Past Medical History:  Diagnosis Date   Atypical chest pain    Hyperlipidemia    Hypertension    Palpitation    Reflux esophagitis     Patient Active Problem List   Diagnosis Date Noted   ERECTILE DYSFUNCTION, NON-ORGANIC 03/07/2010   HYPERTENSION 02/08/2010   CHEST PAIN UNSPECIFIED 02/08/2010    History reviewed. No pertinent surgical history.     Family History  Problem Relation Age of Onset   Heart disease Mother     Social History   Tobacco Use   Smoking status: Never   Smokeless tobacco: Never  Vaping Use   Vaping Use: Never used  Substance Use Topics   Alcohol use: No   Drug use: No    Home Medications Prior to Admission medications   Medication Sig Start Date End  Date Taking? Authorizing Provider  amLODipine (NORVASC) 10 MG tablet Take 10 mg by mouth at bedtime.    [provider]  Dextromethorphan-Guaifenesin (CORICIDIN HBP CONGESTION/COUGH) 10-200 MG CAPS Take 1 capsule by mouth as needed. For cold symptoms    [provider]  hydrochlorothiazide (HYDRODIURIL) 25 MG tablet Take 12.5 mg by mouth daily.      [provider]  hydroxypropyl methylcellulose (ISOPTO TEARS) 2.5 % ophthalmic solution Place 1 drop into both eyes 3 (three) times daily as needed. For dry eyes    [provider]  ibuprofen (ADVIL,MOTRIN) 200 MG tablet Take 400 mg by mouth every 6 (six) hours as needed. For pain/fever    [provider]  lisinopril (PRINIVIL,ZESTRIL) 40 MG tablet Take 40 mg by mouth daily.      [provider]  pantoprazole (PROTONIX) 40 MG tablet Take 40 mg by mouth daily.      [provider]  tadalafil (CIALIS) 20 MG tablet Take 20 mg by mouth daily as needed. For activity    [provider]    Allergies    Patient has no known allergies.  Review of Systems   Review of Systems  Constitutional:  Negative for fever.  HENT:  Negative for sore throat.   Respiratory:  Negative for shortness of breath.   Cardiovascular:  Negative for chest pain.  Gastrointestinal:  Negative for abdominal pain and vomiting.  Genitourinary:  Negative for dysuria.  Skin:  Positive for rash.  Neurological:  Negative for headaches.   Physical Exam Updated Vital Signs BP (!) 169/69 (BP Location: Left Arm)   Pulse 64   Temp 98.4 F (36.9 C) (Oral)   Resp 18   Ht 5\' 7"  (1.702 m)   Wt 89.8 kg   SpO2 100%   BMI 31.01 kg/m   Physical Exam Vitals and nursing note reviewed.  Constitutional:      Appearance: Normal appearance. He is well-developed.  HENT:     Head: Normocephalic and atraumatic.  Eyes:     Conjunctiva/sclera: Conjunctivae normal.  Cardiovascular:     Rate and Rhythm: Normal rate and  regular rhythm.     Pulses: Normal pulses.     Heart sounds: No murmur heard. Pulmonary:     Effort: Pulmonary effort is normal. No respiratory distress.     Breath sounds: Normal breath sounds.  Abdominal:     Palpations: Abdomen is soft.     Tenderness: There is no abdominal tenderness. There is no guarding or rebound.  Musculoskeletal:        General: No deformity or signs of injury. Normal range of motion.     Cervical back: Neck supple.  Skin:    General: Skin is warm and dry.     Findings: Rash present.     Comments: Patient has raised papules and patches over his arms and trunk.  Neurological:     General: No focal deficit present.     Mental Status: He is alert.    ED Results / Procedures / Treatments   Labs (all labs ordered are listed, but only abnormal results are displayed) Labs Reviewed - No data to display  EKG None  Radiology No results found.  Procedures Procedures   Medications Ordered in ED Medications - No data to display  ED Course  I have reviewed the triage vital signs and the nursing notes.  Pertinent labs & imaging results that were available during my care of the patient were reviewed by me and considered in my medical decision making (see chart for details).    MDM Rules/Calculators/A&P                          65 year old male here with 2 to 3 weeks of progressive rash.  Painless although itchy.  No systemic toxicity.  Appears to be some sort of contact dermatitis although patient cannot come up with any clear exposure.  Not diabetic so we will cover with some steroids and recommended close follow-up with PCP.  Return instructions discussed  Final Clinical Impression(s) / ED Diagnoses Final diagnoses:  Rash and nonspecific skin eruption    Rx / DC Orders ED Discharge Orders          Ordered    predniSONE (DELTASONE) 20 MG tablet  Daily        12/07/20 0908             02/06/21, MD 12/07/20 1752

## 2021-01-22 DIAGNOSIS — H401133 Primary open-angle glaucoma, bilateral, severe stage: Secondary | ICD-10-CM | POA: Diagnosis not present

## 2021-06-07 DIAGNOSIS — Z79899 Other long term (current) drug therapy: Secondary | ICD-10-CM | POA: Diagnosis not present

## 2021-06-07 DIAGNOSIS — N529 Male erectile dysfunction, unspecified: Secondary | ICD-10-CM | POA: Diagnosis not present

## 2021-06-07 DIAGNOSIS — E7800 Pure hypercholesterolemia, unspecified: Secondary | ICD-10-CM | POA: Diagnosis not present

## 2021-06-07 DIAGNOSIS — I10 Essential (primary) hypertension: Secondary | ICD-10-CM | POA: Diagnosis not present

## 2021-06-11 DIAGNOSIS — E7800 Pure hypercholesterolemia, unspecified: Secondary | ICD-10-CM | POA: Diagnosis not present

## 2021-06-11 DIAGNOSIS — I10 Essential (primary) hypertension: Secondary | ICD-10-CM | POA: Diagnosis not present

## 2021-06-11 DIAGNOSIS — N529 Male erectile dysfunction, unspecified: Secondary | ICD-10-CM | POA: Diagnosis not present

## 2021-06-11 DIAGNOSIS — L309 Dermatitis, unspecified: Secondary | ICD-10-CM | POA: Diagnosis not present

## 2021-06-26 ENCOUNTER — Encounter: Payer: Self-pay | Admitting: Internal Medicine

## 2021-08-26 ENCOUNTER — Encounter: Payer: Self-pay | Admitting: *Deleted

## 2021-09-11 ENCOUNTER — Ambulatory Visit: Payer: BC Managed Care – PPO

## 2022-02-04 ENCOUNTER — Encounter: Payer: Self-pay | Admitting: *Deleted

## 2022-07-11 DIAGNOSIS — I10 Essential (primary) hypertension: Secondary | ICD-10-CM | POA: Diagnosis not present

## 2022-07-11 DIAGNOSIS — E7800 Pure hypercholesterolemia, unspecified: Secondary | ICD-10-CM | POA: Diagnosis not present

## 2022-07-11 DIAGNOSIS — D649 Anemia, unspecified: Secondary | ICD-10-CM | POA: Diagnosis not present

## 2022-07-11 DIAGNOSIS — E6609 Other obesity due to excess calories: Secondary | ICD-10-CM | POA: Diagnosis not present

## 2022-07-18 DIAGNOSIS — E538 Deficiency of other specified B group vitamins: Secondary | ICD-10-CM | POA: Diagnosis not present

## 2022-07-18 DIAGNOSIS — I10 Essential (primary) hypertension: Secondary | ICD-10-CM | POA: Diagnosis not present

## 2022-07-18 DIAGNOSIS — Z0000 Encounter for general adult medical examination without abnormal findings: Secondary | ICD-10-CM | POA: Diagnosis not present

## 2022-07-18 DIAGNOSIS — E7800 Pure hypercholesterolemia, unspecified: Secondary | ICD-10-CM | POA: Diagnosis not present

## 2022-07-18 DIAGNOSIS — D649 Anemia, unspecified: Secondary | ICD-10-CM | POA: Diagnosis not present

## 2022-08-19 DIAGNOSIS — H401133 Primary open-angle glaucoma, bilateral, severe stage: Secondary | ICD-10-CM | POA: Diagnosis not present

## 2022-08-19 DIAGNOSIS — Z961 Presence of intraocular lens: Secondary | ICD-10-CM | POA: Diagnosis not present

## 2022-08-20 ENCOUNTER — Ambulatory Visit: Payer: BC Managed Care – PPO | Admitting: Family Medicine

## 2022-10-20 ENCOUNTER — Ambulatory Visit (INDEPENDENT_AMBULATORY_CARE_PROVIDER_SITE_OTHER): Payer: BC Managed Care – PPO | Admitting: Family Medicine

## 2022-10-20 ENCOUNTER — Encounter: Payer: Self-pay | Admitting: Family Medicine

## 2022-10-20 VITALS — BP 178/65 | HR 47 | Temp 98.0°F | Ht 67.0 in | Wt 199.0 lb

## 2022-10-20 DIAGNOSIS — Z1211 Encounter for screening for malignant neoplasm of colon: Secondary | ICD-10-CM

## 2022-10-20 DIAGNOSIS — L309 Dermatitis, unspecified: Secondary | ICD-10-CM | POA: Diagnosis not present

## 2022-10-20 DIAGNOSIS — E785 Hyperlipidemia, unspecified: Secondary | ICD-10-CM

## 2022-10-20 DIAGNOSIS — I10 Essential (primary) hypertension: Secondary | ICD-10-CM

## 2022-10-20 MED ORDER — TRIAMCINOLONE ACETONIDE 0.1 % EX CREA: 1.0000 | TOPICAL_CREAM | Freq: Three times a day (TID) | CUTANEOUS | 1 refills | Status: DC

## 2022-10-20 MED ORDER — LISINOPRIL 40 MG PO TABS: 40.0000 mg | ORAL_TABLET | Freq: Every day | ORAL | 1 refills | Status: DC

## 2022-10-20 NOTE — Progress Notes (Signed)
New Patient Office Visit  Subjective    Patient ID: Christopher Gallegos, male    DOB: 1955-10-27  Age: 66 y.o. MRN: [id number]  CC:  Chief Complaint  Patient presents with   Establish Care    HPI Christopher Gallegos presents to establish care. Oriented to practice routines and expectations. Has been seeing a PCP regularly. PMH includes HTN, HLD, GERD, and eczema.   Colon CA screening: ordered Cologuard Prostate CA screening: Prostate cancer screening and PSA options (with potential risks and benefits of testing vs. not testing) were discussed along with recent recs/guidelines.  Done 07/18/2022 Tobacco: non-smoker Vaccines:  UTD  HYPERTENSION / HYPERLIPIDEMIA Satisfied with current treatment? yes Duration of hypertension: chronic BP monitoring frequency: a few times a week BP range: 130-140s BP medication side effects: no Past BP meds: amlodipine, HCTZ, and lisinopril Duration of hyperlipidemia: chronic Cholesterol medication side effects: no Cholesterol supplements: none Past cholesterol medications: atorvastain (lipitor) Medication compliance: excellent compliance Aspirin: no Recent stressors: no Recurrent headaches: no Visual changes: no Palpitations: no Dyspnea: no Chest pain: no Lower extremity edema: no Dizzy/lightheaded: no   Outpatient Encounter Medications as of 10/20/2022  Medication Sig   amLODipine (NORVASC) 10 MG tablet Take 10 mg by mouth at bedtime.   atorvastatin (LIPITOR) 40 MG tablet Take 40 mg by mouth daily.   ibuprofen (ADVIL,MOTRIN) 200 MG tablet Take 400 mg by mouth every 6 (six) hours as needed. For pain/fever   [DISCONTINUED] lisinopril (ZESTRIL) 20 MG tablet Take 20 mg by mouth daily.   [DISCONTINUED] triamcinolone cream (KENALOG) 0.1 % Apply 1 application topically in the morning, at noon, and at bedtime.   lisinopril (ZESTRIL) 40 MG tablet Take 1 tablet (40 mg total) by mouth daily.   triamcinolone cream (KENALOG) 0.1 % Apply 1 Application  topically in the morning, at noon, and at bedtime. Apply 1 application topically in the morning, at noon, and at bedtime.   [DISCONTINUED] Dextromethorphan-Guaifenesin (CORICIDIN HBP CONGESTION/COUGH) 10-200 MG CAPS Take 1 capsule by mouth as needed. For cold symptoms (Patient not taking: Reported on 10/20/2022)   [DISCONTINUED] hydrochlorothiazide (HYDRODIURIL) 25 MG tablet Take 12.5 mg by mouth daily.   (Patient not taking: Reported on 10/20/2022)   [DISCONTINUED] hydroxypropyl methylcellulose (ISOPTO TEARS) 2.5 % ophthalmic solution Place 1 drop into both eyes 3 (three) times daily as needed. For dry eyes (Patient not taking: Reported on 10/20/2022)   [DISCONTINUED] pantoprazole (PROTONIX) 40 MG tablet Take 40 mg by mouth daily.   (Patient not taking: Reported on 10/20/2022)   [DISCONTINUED] predniSONE (DELTASONE) 20 MG tablet Take 2 tablets (40 mg total) by mouth daily. (Patient not taking: Reported on 10/20/2022)   [DISCONTINUED] tadalafil (CIALIS) 20 MG tablet Take 20 mg by mouth daily as needed. For activity (Patient not taking: Reported on 10/20/2022)   No facility-administered encounter medications on file as of 10/20/2022.    Past Medical History:  Diagnosis Date   Atypical chest pain    Hyperlipidemia    Hypertension    Palpitation    Reflux esophagitis     History reviewed. No pertinent surgical history.  Family History  Problem Relation Age of Onset   Heart disease Mother     Social History   Socioeconomic History   Marital status: Married    Spouse name: Not on file   Number of children: Not on file   Years of education: Not on file   Highest education level: Not on file  Occupational History   Occupation: Full  time    Employer: [employer]  Tobacco Use   Smoking status: Never   Smokeless tobacco: Never  Vaping Use   Vaping status: Never Used  Substance and Sexual Activity   Alcohol use: No   Drug use: No   Sexual activity: Yes    Birth  control/protection: None  Other Topics Concern   Not on file  Social History Narrative   Married   No regular exercise   Social Determinants of Health   Financial Resource Strain: Not on file  Food Insecurity: Not on file  Transportation Needs: Not on file  Physical Activity: Not on file  Stress: Not on file  Social Connections: Not on file  Intimate Partner Violence: Not on file    Review of Systems  Constitutional: Negative.   HENT: Negative.    Eyes: Negative.   Respiratory: Negative.    Cardiovascular: Negative.   Gastrointestinal: Negative.   Genitourinary: Negative.   Musculoskeletal: Negative.   Skin: Negative.   Neurological: Negative.   Endo/Heme/Allergies: Negative.   Psychiatric/Behavioral: Negative.    All other systems reviewed and are negative.       Objective    BP (!) 178/65 Comment: pt's home blood pressure cuff, NP aware  Pulse (!) 47   Temp 98 F (36.7 C) (Oral)   Ht 5\' 7"  (1.702 m)   Wt 199 lb (90.3 kg)   SpO2 98%   BMI 31.17 kg/m   Physical Exam Vitals and nursing note reviewed.  Constitutional:      Appearance: Normal appearance. He is normal weight.  HENT:     Head: Normocephalic and atraumatic.  Cardiovascular:     Rate and Rhythm: Regular rhythm. Bradycardia present.     Pulses: Normal pulses.     Heart sounds: Normal heart sounds.  Pulmonary:     Effort: Pulmonary effort is normal.     Breath sounds: Normal breath sounds.  Skin:    General: Skin is warm and dry.     Capillary Refill: Capillary refill takes less than 2 seconds.  Neurological:     General: No focal deficit present.     Mental Status: He is alert and oriented to person, place, and time. Mental status is at baseline.  Psychiatric:        Mood and Affect: Mood normal.        Behavior: Behavior normal.        Thought Content: Thought content normal.        Judgment: Judgment normal.         Assessment & Plan:   Problem List Items Addressed This Visit      Hypertension - Primary    Chronic. BP goal less than 130/80. BP 166/82 goal today in clinic. He reports values >140/90 at home. Discussed options with patient.  He is on Amlodipine 10mg  daily and Lisinopril 20mg  daily, will increase to Lisinopril 40mg  daily. Encouraged heart healthy diet and 150 minutes moderate intensity exercise weekly. Will monitor at home and report values at end of the week. Follow up in 4 weeks.      Relevant Medications   lisinopril (ZESTRIL) 40 MG tablet   Hyperlipidemia    Continue Atorvastatin 40mg  daily. I recommend consuming a heart healthy diet such as Mediterranean diet or DASH diet with whole grains, fruits, vegetable, fish, lean meats, nuts, and olive oil. Limit sweets and processed foods. I also encourage moderate intensity exercise 150 minutes weekly. This is 3-5 times weekly for  30-50 minutes each session. Goal should be pace of 3 miles/hours, or walking 1.5 miles in 30 minutes.      Relevant Medications   lisinopril (ZESTRIL) 40 MG tablet   Eczema   Other Visit Diagnoses     Colon cancer screening       Relevant Orders   Cologuard       Return in about 4 weeks (around 11/17/2022) for hypertension.   Park Meo, FNP

## 2022-10-20 NOTE — Assessment & Plan Note (Signed)
Chronic. BP goal less than 130/80. BP 166/82 goal today in clinic. He reports values >140/90 at home. Discussed options with patient.  He is on Amlodipine 10mg  daily and Lisinopril 20mg  daily, will increase to Lisinopril 40mg  daily. Encouraged heart healthy diet and 150 minutes moderate intensity exercise weekly. Will monitor at home and report values at end of the week. Follow up in 4 weeks.

## 2022-10-20 NOTE — Assessment & Plan Note (Signed)
Continue Atorvastatin 40mg  daily. I recommend consuming a heart healthy diet such as Mediterranean diet or DASH diet with whole grains, fruits, vegetable, fish, lean meats, nuts, and olive oil. Limit sweets and processed foods. I also encourage moderate intensity exercise 150 minutes weekly. This is 3-5 times weekly for 30-50 minutes each session. Goal should be pace of 3 miles/hours, or walking 1.5 miles in 30 minutes.

## 2022-10-29 DIAGNOSIS — H18413 Arcus senilis, bilateral: Secondary | ICD-10-CM | POA: Diagnosis not present

## 2022-10-29 DIAGNOSIS — H25093 Other age-related incipient cataract, bilateral: Secondary | ICD-10-CM | POA: Diagnosis not present

## 2022-10-29 DIAGNOSIS — H401133 Primary open-angle glaucoma, bilateral, severe stage: Secondary | ICD-10-CM | POA: Diagnosis not present

## 2022-11-17 ENCOUNTER — Ambulatory Visit: Payer: BC Managed Care – PPO | Admitting: Family Medicine

## 2022-11-17 ENCOUNTER — Encounter: Payer: Self-pay | Admitting: Family Medicine

## 2022-11-17 VITALS — BP 150/70 | Temp 98.1°F | Ht 67.0 in | Wt 198.0 lb

## 2022-11-17 DIAGNOSIS — E559 Vitamin D deficiency, unspecified: Secondary | ICD-10-CM

## 2022-11-17 DIAGNOSIS — E785 Hyperlipidemia, unspecified: Secondary | ICD-10-CM | POA: Diagnosis not present

## 2022-11-17 DIAGNOSIS — I10 Essential (primary) hypertension: Secondary | ICD-10-CM

## 2022-11-17 NOTE — Progress Notes (Signed)
Acute Office Visit  Subjective:     Patient ID: Christopher Gallegos, male    DOB: 19-Jan-1956, 67 y.o.   MRN: [id number]  Chief Complaint  Patient presents with   Follow-up   Hypertension    Hypertension   Patient is in today for blood pressure follow-up. At his initial visit to establish care his BP readings was 178/65 and we increased his Lisinopril to 40mg  daily. He is also taking Amlodipine 10mg  daily. Home readings have been 153/60, 122/66, 124/62, 139/64, 130/66, 116/59, and 126/54. His home cuff was reading 10 points higher when checked in office. Labs from prior PCP reviewed drawn 07/14/2022. He is taking Vitamin D 5000 units daily.   HYPERTENSION without Chronic Kidney Disease Hypertension status: controlled  Satisfied with current treatment? yes Duration of hypertension: chronic BP monitoring frequency:  daily BP range: 116/59-153/60 BP medication side effects:  no Medication compliance: excellent compliance Previous BP meds:amlodipine and lisinopril Aspirin: no Recurrent headaches: no Visual changes: no Palpitations: no Dyspnea: no Chest pain: no Lower extremity edema: no Dizzy/lightheaded: no   Review of Systems  All other systems reviewed and are negative.       Objective:    BP (!) 150/70   Temp 98.1 F (36.7 C) (Oral)   Ht 5\' 7"  (1.702 m)   Wt 198 lb (89.8 kg)   BMI 31.01 kg/m  BP Readings from Last 3 Encounters:  11/17/22 (!) 150/70  10/20/22 (!) 178/65  12/07/20 (!) 169/69   Wt Readings from Last 3 Encounters:  11/17/22 198 lb (89.8 kg)  10/20/22 199 lb (90.3 kg)  12/07/20 198 lb (89.8 kg)      Physical Exam Vitals and nursing note reviewed.  Constitutional:      Appearance: Normal appearance. He is obese.  HENT:     Head: Normocephalic and atraumatic.  Neck:     Vascular: No carotid bruit.  Cardiovascular:     Rate and Rhythm: Normal rate and regular rhythm.     Pulses: Normal pulses.     Heart sounds: Normal heart sounds.   Pulmonary:     Effort: Pulmonary effort is normal.     Breath sounds: Normal breath sounds.  Skin:    General: Skin is warm and dry.     Capillary Refill: Capillary refill takes less than 2 seconds.  Neurological:     General: No focal deficit present.     Mental Status: He is alert and oriented to person, place, and time. Mental status is at baseline.  Psychiatric:        Mood and Affect: Mood normal.        Behavior: Behavior normal.        Thought Content: Thought content normal.        Judgment: Judgment normal.     No results found for any visits on 11/17/22.      Assessment & Plan:   Problem List Items Addressed This Visit     Hypertension - Primary    Chronic. BP goal less than 140/90. Home readings <140/90 on average. Continue Amlodipine 10mg  daily and Lisinopril 40mg  daily and follow up in 3 months.  Encouraged heart healthy diet and 150 minutes moderate intensity exercise weekly. Seek medical care for chest pain, palpitations, vision changes, recurrent headaches, shortness of breath, swelling of extremities.       Relevant Orders   CBC with Differential/Platelet   COMPLETE METABOLIC PANEL WITH GFR   Lipid panel   Hyperlipidemia  Relevant Orders   Lipid panel   Other Visit Diagnoses     Vitamin D deficiency       Relevant Orders   VITAMIN D 25 Hydroxy (Vit-D Deficiency, Fractures)       No orders of the defined types were placed in this encounter.   Return in about 3 months (around 02/16/2023) for chronic follow-up with labs 1 week prior.  Park Meo, FNP

## 2022-11-17 NOTE — Assessment & Plan Note (Signed)
Chronic. BP goal less than 140/90. Home readings <140/90 on average. Continue Amlodipine 10mg  daily and Lisinopril 40mg  daily and follow up in 3 months.  Encouraged heart healthy diet and 150 minutes moderate intensity exercise weekly. Seek medical care for chest pain, palpitations, vision changes, recurrent headaches, shortness of breath, swelling of extremities.

## 2023-05-05 ENCOUNTER — Other Ambulatory Visit: Payer: Self-pay | Admitting: Family Medicine

## 2023-06-29 ENCOUNTER — Other Ambulatory Visit: Payer: Self-pay | Admitting: Family Medicine

## 2023-06-29 NOTE — Telephone Encounter (Signed)
 Copied from CRM [id number]. Topic: Clinical - Medication Refill >> Jun 29, 2023 12:32 PM Oddis Bench wrote: Most Recent Primary Care Visit:  Provider: Yolanda Hence S  Department: [facility]  Visit Type: FOLLOW UP 15  Date: 11/17/2022  Medication: lisinopril  (ZESTRIL ) 40 MG tablet and amLODipine (NORVASC) 10 MG tablet  Has the patient contacted their pharmacy? Yes (Agent: If no, request that the patient contact the pharmacy for the refill. If patient does not wish to contact the pharmacy document the reason why and proceed with request.) (Agent: If yes, when and what did the pharmacy advise?)  Is this the correct pharmacy for this prescription? Yes If no, delete pharmacy and type the correct one.  This is the patient's preferred pharmacy:  [facility] [address] [place] - [address] [place] [address] Phone: [phone] Fax: [phone]  [facility] - [place] - [address] [place]  [address] Phone: [phone] Fax: [phone]   Has the prescription been filled recently? No  Is the patient out of the medication? No  Has the patient been seen for an appointment in the last year OR does the patient have an upcoming appointment? No  Can we respond through MyChart? Yes  Agent: Please be advised that Rx refills may take up to 3 business days. We ask that you follow-up with your pharmacy.

## 2023-07-01 MED ORDER — AMLODIPINE BESYLATE 10 MG PO TABS: 10.0000 mg | ORAL_TABLET | Freq: Every day | ORAL | 1 refills | Status: DC

## 2023-07-01 NOTE — Telephone Encounter (Signed)
 Lisinopril  too soon for refill.  Requested Prescriptions  Pending Prescriptions Disp Refills   amLODipine (NORVASC) 10 MG tablet      Sig: Take 1 tablet (10 mg total) by mouth at bedtime.     Cardiovascular: Calcium Channel Blockers 2 Failed - 07/01/2023  2:22 PM      Failed - Last BP in normal range    BP Readings from Last 1 Encounters:  11/17/22 (!) 150/70         Failed - Valid encounter within last 6 months    Recent Outpatient Visits           7 months ago Hypertension, unspecified type   [place] [place] [facility] Jenelle Mis, FNP   8 months ago Hypertension, unspecified type   [facility] Jenelle Mis, FNP              Passed - Last Heart Rate in normal range    Pulse Readings from Last 1 Encounters:  10/20/22 (!) 47          lisinopril  (ZESTRIL ) 40 MG tablet 90 tablet 0    Sig: Take 1 tablet (40 mg total) by mouth daily.     Cardiovascular:  ACE Inhibitors Failed - 07/01/2023  2:22 PM      Failed - Cr in normal range and within 180 days    Creatinine, Ser  Date Value Ref Range Status  01/29/2010 1.08 0.4 - 1.5 mg/dL Final         Failed - K in normal range and within 180 days    Potassium  Date Value Ref Range Status  01/29/2010 3.8 3.5 - 5.1 mEq/L Final         Failed - Last BP in normal range    BP Readings from Last 1 Encounters:  11/17/22 (!) 150/70         Failed - Valid encounter within last 6 months    Recent Outpatient Visits           7 months ago Hypertension, unspecified type   [facility] Jenelle Mis, FNP   8 months ago Hypertension, unspecified type    [facility] Jenelle Mis, [place]              Passed - Patient is not pregnant

## 2023-07-01 NOTE — Telephone Encounter (Signed)
 Requested medication (s) are due for refill today: routing for review  Requested medication (s) are on the active medication list: no  Last refill:  05/11/11  Future visit scheduled: yes  Notes to clinic:  Unable to refill per protocol, last refill by another/historical provider.      Requested Prescriptions  Pending Prescriptions Disp Refills   amLODipine (NORVASC) 10 MG tablet      Sig: Take 1 tablet (10 mg total) by mouth at bedtime.     Cardiovascular: Calcium Channel Blockers 2 Failed - 07/01/2023  2:30 PM      Failed - Last BP in normal range    BP Readings from Last 1 Encounters:  11/17/22 (!) 150/70         Failed - Valid encounter within last 6 months    Recent Outpatient Visits           7 months ago Hypertension, unspecified type   [facility] Jenelle Mis, FNP   8 months ago Hypertension, unspecified type   [place] Jenelle Mis, FNP              Passed - Last Heart Rate in normal range    Pulse Readings from Last 1 Encounters:  10/20/22 (!) 47         Refused Prescriptions Disp Refills   lisinopril  (ZESTRIL ) 40 MG tablet 90 tablet 0    Sig: Take 1 tablet (40 mg total) by mouth daily.     Cardiovascular:  ACE Inhibitors Failed - 07/01/2023  2:30 PM      Failed - Cr in normal range and within 180 days    Creatinine, Ser  Date Value Ref Range Status  01/29/2010 1.08 0.4 - 1.5 mg/dL Final         Failed - K in normal range and within 180 days    Potassium  Date Value Ref Range Status  01/29/2010 3.8 3.5 - 5.1 mEq/L Final         Failed - Last BP in normal range    BP Readings from Last 1 Encounters:  11/17/22 (!) 150/70         Failed - Valid encounter within last 6 months    Recent Outpatient Visits           7 months ago Hypertension, unspecified type    [facility] [facility] Jenelle Mis, FNP   8 months ago Hypertension, unspecified type   [facility] Jenelle Mis, [place]              Passed - Patient is not pregnant

## 2023-07-10 ENCOUNTER — Other Ambulatory Visit: Payer: Self-pay | Admitting: Family Medicine

## 2023-07-10 NOTE — Telephone Encounter (Unsigned)
 Copied from CRM [id number]. Topic: Clinical - Medication Refill >> Jul 10, 2023  8:19 AM Jeris Montes S wrote: Medication: lisinopril  (ZESTRIL ) 40 MG tablet  Has the patient contacted their pharmacy? Yes (Agent: If no, request that the patient contact the pharmacy for the refill. If patient does not wish to contact the pharmacy document the reason why and proceed with request.) (Agent: If yes, when and what did the pharmacy advise?)  This is the patient's preferred pharmacy:  [facility] [address], [place] - [address] [place] [address] Phone: [phone] Fax: [phone]   Is this the correct pharmacy for this prescription? Yes If no, delete pharmacy and type the correct one.   Has the prescription been filled recently? No  Is the patient out of the medication? Yes- PATIENT IS OUT OF MEDICATION  Has the patient been seen for an appointment in the last year OR does the patient have an upcoming appointment? Yes  Can we respond through MyChart? No  Agent: Please be advised that Rx refills may take up to 3 business days. We ask that you follow-up with your pharmacy.

## 2023-07-13 NOTE — Telephone Encounter (Signed)
 Requested medications are due for refill today.  yes  Requested medications are on the active medications list.  yes  Last refill. 05/05/2023 #90 0 rf  Future visit scheduled.   yes  Notes to clinic.  Expired labs.    Requested Prescriptions  Pending Prescriptions Disp Refills   lisinopril  (ZESTRIL ) 40 MG tablet 90 tablet 0    Sig: Take 1 tablet (40 mg total) by mouth daily.     Cardiovascular:  ACE Inhibitors Failed - 07/13/2023 12:33 PM      Failed - Cr in normal range and within 180 days    Creatinine, Ser  Date Value Ref Range Status  01/29/2010 1.08 0.4 - 1.5 mg/dL Final         Failed - K in normal range and within 180 days    Potassium  Date Value Ref Range Status  01/29/2010 3.8 3.5 - 5.1 mEq/L Final         Failed - Last BP in normal range    BP Readings from Last 1 Encounters:  11/17/22 (!) 150/70         Failed - Valid encounter within last 6 months    Recent Outpatient Visits           7 months ago Hypertension, unspecified type   [facility] Jenelle Mis, FNP   8 months ago Hypertension, unspecified type   [facility] Jenelle Mis, [place]              Passed - Patient is not pregnant

## 2023-07-14 MED ORDER — LISINOPRIL 40 MG PO TABS: 40.0000 mg | ORAL_TABLET | Freq: Every day | ORAL | 0 refills | Status: DC

## 2023-07-21 ENCOUNTER — Encounter: Admitting: Family Medicine

## 2023-08-13 ENCOUNTER — Encounter: Payer: Self-pay | Admitting: Family Medicine

## 2023-08-13 ENCOUNTER — Ambulatory Visit: Admitting: Family Medicine

## 2023-08-13 VITALS — BP 159/78 | HR 59 | Temp 98.2°F | Ht 67.0 in | Wt 203.2 lb

## 2023-08-13 DIAGNOSIS — E7800 Pure hypercholesterolemia, unspecified: Secondary | ICD-10-CM | POA: Diagnosis not present

## 2023-08-13 DIAGNOSIS — I10 Essential (primary) hypertension: Secondary | ICD-10-CM

## 2023-08-13 LAB — CBC WITH DIFFERENTIAL/PLATELET
Absolute Lymphocytes: 2298 {cells}/uL (ref 850–3900)
Absolute Monocytes: 540 {cells}/uL (ref 200–950)
Basophils Absolute: 72 {cells}/uL (ref 0–200)
Basophils Relative: 1.2 %
Eosinophils Absolute: 318 {cells}/uL (ref 15–500)
Eosinophils Relative: 5.3 %
HCT: 43.1 % (ref 38.5–50.0)
Hemoglobin: 13.3 g/dL (ref 13.2–17.1)
MCH: 27.7 pg (ref 27.0–33.0)
MCHC: 30.9 g/dL — ABNORMAL LOW (ref 32.0–36.0)
MCV: 89.6 fL (ref 80.0–100.0)
MPV: 10.7 fL (ref 7.5–12.5)
Monocytes Relative: 9 %
Neutro Abs: 2772 {cells}/uL (ref 1500–7800)
Neutrophils Relative %: 46.2 %
Platelets: 286 10*3/uL (ref 140–400)
RBC: 4.81 10*6/uL (ref 4.20–5.80)
RDW: 11.3 % (ref 11.0–15.0)
Total Lymphocyte: 38.3 %
WBC: 6 10*3/uL (ref 3.8–10.8)

## 2023-08-13 LAB — LIPID PANEL
Cholesterol: 157 mg/dL (ref <200)
HDL: 62 mg/dL (ref >=40)
LDL Cholesterol (Calc): 82 mg/dL
Non-HDL Cholesterol (Calc): 95 mg/dL (ref <130)
Total CHOL/HDL Ratio: 2.5 (calc) (ref <5.0)
Triglycerides: 47 mg/dL (ref <150)

## 2023-08-13 LAB — COMPREHENSIVE METABOLIC PANEL WITH GFR
AG Ratio: 1.5 (calc) (ref 1.0–2.5)
ALT: 24 U/L (ref 9–46)
AST: 19 U/L (ref 10–35)
Albumin: 4.1 g/dL (ref 3.6–5.1)
Alkaline phosphatase (APISO): 59 U/L (ref 35–144)
BUN: 10 mg/dL (ref 7–25)
CO2: 27 mmol/L (ref 20–32)
Calcium: 9.4 mg/dL (ref 8.6–10.3)
Chloride: 106 mmol/L (ref 98–110)
Creat: 1.1 mg/dL (ref 0.70–1.35)
Globulin: 2.8 g/dL (ref 1.9–3.7)
Glucose, Bld: 101 mg/dL — ABNORMAL HIGH (ref 65–99)
Potassium: 4.8 mmol/L (ref 3.5–5.3)
Sodium: 140 mmol/L (ref 135–146)
Total Bilirubin: 0.9 mg/dL (ref 0.2–1.2)
Total Protein: 6.9 g/dL (ref 6.1–8.1)
eGFR: 74 mL/min/{1.73_m2} (ref >=60)

## 2023-08-13 MED ORDER — HYDROCHLOROTHIAZIDE 25 MG PO TABS: 25.0000 mg | ORAL_TABLET | Freq: Every day | ORAL | 3 refills | Status: DC

## 2023-08-13 NOTE — Progress Notes (Signed)
 Subjective:    Patient ID: Christopher Gallegos, male    DOB: 24-May-1955, 68 y.o.   MRN: [id number]  HPI Patient is here today to establish care.  Past medical history is significant for hypertension and hyperlipidemia.  He states that his prostate was checked less than a year ago by his previous PCP.  He states that he is due to to have a colonoscopy.  He has never had a colonoscopy.  He refuses a colonoscopy today.  We also discussed Cologuard but he declines this.  He is overdue for Pneumovax 23.  He is overdue for Shingrix.  He declines all vaccinations today.  Blood pressure is elevated 159/78.  Denies chest pain shortness of breath or dyspnea exertion. Past Medical History:  Diagnosis Date   Atypical chest pain    Hyperlipidemia    Hypertension    Palpitation    Reflux esophagitis    History reviewed. No pertinent surgical history. Current Outpatient Medications on File Prior to Visit  Medication Sig Dispense Refill   amLODipine  (NORVASC ) 10 MG tablet Take 1 tablet (10 mg total) by mouth at bedtime. PT NEEDS APPOINTMENT BEFORE FURTHER REFILLS. 30 tablet 1   atorvastatin (LIPITOR) 40 MG tablet Take 40 mg by mouth daily.     brimonidine (ALPHAGAN) 0.2 % ophthalmic solution SMARTSIG:In Eye(s)     Cholecalciferol (DIALYVITE VITAMIN D 5000 PO) Take 5,000 Units by mouth daily at 6 (six) AM.     dorzolamide-timolol (COSOPT) 2-0.5 % ophthalmic solution SMARTSIG:In Eye(s)     latanoprost (XALATAN) 0.005 % ophthalmic solution SMARTSIG:In Eye(s)     lisinopril  (ZESTRIL ) 40 MG tablet Take 1 tablet (40 mg total) by mouth daily. 90 tablet 0   triamcinolone  cream (KENALOG ) 0.1 % Apply 1 Application topically in the morning, at noon, and at bedtime. Apply 1 application topically in the morning, at noon, and at bedtime. 30 g 1   No current facility-administered medications on file prior to visit.   No Known Allergies Social History   Socioeconomic History   Marital status: Married    Spouse name:  Not on file   Number of children: Not on file   Years of education: Not on file   Highest education level: High school graduate  Occupational History   Occupation: Full time    Employer: [employer]  Tobacco Use   Smoking status: Never   Smokeless tobacco: Never  Vaping Use   Vaping status: Never Used  Substance and Sexual Activity   Alcohol use: No   Drug use: No   Sexual activity: Yes    Birth control/protection: None  Other Topics Concern   Not on file  Social History Narrative   Married   No regular exercise   Social Drivers of Corporate investment banker Strain: Not on file  Food Insecurity: Not on file  Transportation Needs: Not on file  Physical Activity: Not on file  Stress: Not on file  Social Connections: Not on file  Intimate Partner Violence: Not on file   Family History  Problem Relation Age of Onset   Heart disease Mother       Review of Systems  All other systems reviewed and are negative.      Objective:   Physical Exam Vitals reviewed.  Constitutional:      Appearance: Normal appearance. He is normal weight.  HENT:     Mouth/Throat:     Mouth: Mucous membranes are moist.     Pharynx:  Oropharynx is clear. No oropharyngeal exudate.   Eyes:     Extraocular Movements: Extraocular movements intact.     Conjunctiva/sclera: Conjunctivae normal.     Pupils: Pupils are equal, round, and reactive to light.   Neck:     Vascular: No carotid bruit.   Cardiovascular:     Rate and Rhythm: Normal rate and regular rhythm.     Pulses: Normal pulses.     Heart sounds: Normal heart sounds. No murmur heard.    No friction rub. No gallop.  Pulmonary:     Effort: Pulmonary effort is normal. No respiratory distress.     Breath sounds: Normal breath sounds. No wheezing, rhonchi or rales.  Abdominal:     General: Abdomen is flat. There is no distension.     Palpations: Abdomen is soft.     Tenderness: There is no abdominal tenderness. There is  no guarding or rebound.   Musculoskeletal:     Right lower leg: No edema.     Left lower leg: No edema.  Lymphadenopathy:     Cervical: No cervical adenopathy.   Neurological:     General: No focal deficit present.     Mental Status: He is alert and oriented to person, place, and time. Mental status is at baseline.     Deep Tendon Reflexes: Reflexes normal.   Psychiatric:        Mood and Affect: Mood normal.        Behavior: Behavior normal.        Thought Content: Thought content normal.        Judgment: Judgment normal.           Assessment & Plan:   Benign essential HTN - Plan: CBC with Differential/Platelet, Comprehensive metabolic panel with GFR, Lipid panel  Pure hypercholesterolemia Blood pressure today is too high.  Add hydrochlorothiazide 25 mg daily and recheck blood pressure in 1 month.  Check CBC CMP and lipid panel.  Goal LDL cholesterol is less than 100.  Recommended Pneumovax 23.  Recommended Shingrix.  Patient declines.  PSA sounds up-to-date.  Recommended colonoscopy or Cologuard.  Patient defers these at present time

## 2023-08-14 ENCOUNTER — Ambulatory Visit: Payer: Self-pay | Admitting: Family Medicine

## 2023-08-14 ENCOUNTER — Encounter: Payer: Self-pay | Admitting: Family Medicine

## 2023-08-14 NOTE — Telephone Encounter (Signed)
-----   Message from Austine Lefort sent at 08/14/2023  7:08 AM EDT ----- Labs look great.  Recheck bp in 1 month.  ----- Message ----- From: Interface, [facility] Lab Results In Sent: 08/13/2023   6:50 PM EDT To: Austine Lefort, MD

## 2023-08-14 NOTE — Telephone Encounter (Signed)
 Patient aware of results.  Appointment scheduled.  Questioned if new medication is safe to take if he already has glaucoma.  Please advise.

## 2023-08-18 NOTE — Progress Notes (Signed)
 Wife of pt. Has been informed. She stated that the medication makes him urinate frequently he will try taking during the day to see how he tolerates it and report back w/ any side effects.

## 2023-09-02 ENCOUNTER — Other Ambulatory Visit: Payer: Self-pay

## 2023-09-02 NOTE — Telephone Encounter (Signed)
 Prescription Request  09/02/2023  LOV: 08/13/23  What is the name of the medication or equipment? atorvastatin (LIPITOR) 40 MG tablet [[id number]]   Have you contacted your pharmacy to request a refill? Yes   Which pharmacy would you like this sent to?  [facility] - [place], [place] - [address] [place] [address] Phone: [phone] Fax: [phone]    Patient notified that their request is being sent to the clinical staff for review and that they should receive a response within 2 business days.   Please advise at [facility] [phone]  Prescription Request  09/02/2023  LOV: 08/13/23  What is the name of the medication or equipment? amLODipine  (NORVASC ) 10 MG tablet [[id number]]   Have you contacted your pharmacy to request a refill? Yes   Which pharmacy would you like this sent to?  [facility] - [place], [place] - [address] [place] [address] Phone: [phone] Fax: [phone]    Patient notified that their request is being sent to the clinical staff for review and that they should receive a response within 2 business days.   Please advise at [facility] [phone]

## 2023-09-03 NOTE — Telephone Encounter (Signed)
 Requested medication (s) are due for refill today: Yes  Requested medication (s) are on the active medication list: Yes  Last refill:  12/07/20  Future visit scheduled: Yes  Notes to clinic:  Unable to refill per protocol, last refill by another provider.      Requested Prescriptions  Pending Prescriptions Disp Refills   atorvastatin (LIPITOR) 40 MG tablet      Sig: Take 1 tablet (40 mg total) by mouth daily.     Cardiovascular:  Antilipid - Statins Failed - 09/03/2023  8:42 PM      Failed - Lipid Panel in normal range within the last 12 months    Cholesterol  Date Value Ref Range Status  08/13/2023 157 <200 mg/dL Final   LDL Cholesterol (Calc)  Date Value Ref Range Status  08/13/2023 82 mg/dL (calc) Final    Comment:    Reference range: <100 . Desirable range <100 mg/dL for primary prevention;   <70 mg/dL for patients with CHD or diabetic patients  with > or = 2 CHD risk factors. SABRA LDL-C is now calculated using the Martin-Hopkins  calculation, which is a validated novel method providing  better accuracy than the Friedewald equation in the  estimation of LDL-C.  Gladis APPLETHWAITE et al. SANDREA. 7986;689(80): 2061-2068  (http://education.[facility].com/faq/FAQ164)    HDL  Date Value Ref Range Status  08/13/2023 62 > OR = 40 mg/dL Final   Triglycerides  Date Value Ref Range Status  08/13/2023 47 <150 mg/dL Final         Passed - Patient is not pregnant      Passed - Valid encounter within last 12 months    Recent Outpatient Visits           3 weeks ago Benign essential HTN   [facility] Duanne Butler DASEN, [place]   9 months ago Hypertension, unspecified type   [facility] Kayla Jeoffrey RAMAN, FNP   10 months ago Hypertension, unspecified type   [facility] Kayla Jeoffrey RAMAN, [place]

## 2023-09-07 MED ORDER — ATORVASTATIN CALCIUM 40 MG PO TABS: 40.0000 mg | ORAL_TABLET | Freq: Every day | ORAL | 1 refills | Status: DC

## 2023-09-08 ENCOUNTER — Other Ambulatory Visit: Payer: Self-pay

## 2023-09-08 ENCOUNTER — Telehealth: Payer: Self-pay | Admitting: Family Medicine

## 2023-09-08 MED ORDER — AMLODIPINE BESYLATE 10 MG PO TABS: 10.0000 mg | ORAL_TABLET | Freq: Every day | ORAL | 1 refills | Status: DC

## 2023-09-08 NOTE — Telephone Encounter (Signed)
 Prescription Request  09/08/2023  LOV: 11/17/2022  What is the name of the medication or equipment? amLODipine  (NORVASC ) 10 MG tablet   Have you contacted your pharmacy to request a refill? Yes   Which pharmacy would you like this sent to?  [facility] - [place], [place] - [address] [place] [address] Phone: [phone] Fax: [phone]    Patient notified that their request is being sent to the clinical staff for review and that they should receive a response within 2 business days.   Please advise at [facility] [phone]

## 2023-09-08 NOTE — Telephone Encounter (Signed)
Sent in med

## 2023-09-15 ENCOUNTER — Other Ambulatory Visit: Payer: Self-pay | Admitting: Family Medicine

## 2023-09-15 ENCOUNTER — Encounter: Payer: Self-pay | Admitting: Family Medicine

## 2023-09-15 ENCOUNTER — Ambulatory Visit: Admitting: Family Medicine

## 2023-09-15 VITALS — BP 136/78 | HR 58 | Temp 98.3°F | Ht 67.0 in | Wt 200.4 lb

## 2023-09-15 DIAGNOSIS — I10 Essential (primary) hypertension: Secondary | ICD-10-CM

## 2023-09-15 MED ORDER — HYDROCHLOROTHIAZIDE 25 MG PO TABS: 25.0000 mg | ORAL_TABLET | Freq: Every day | ORAL | 3 refills | Status: DC

## 2023-09-15 NOTE — Progress Notes (Signed)
 Subjective:    Patient ID: Christopher Gallegos, male    DOB: 02/27/1956, 68 y.o.   MRN: [id number]  Hypertension  08/13/23 Patient is here today to establish care.  Past medical history is significant for hypertension and hyperlipidemia.  He states that his prostate was checked less than a year ago by his previous PCP.  He states that he is due to to have a colonoscopy.  He has never had a colonoscopy.  He refuses a colonoscopy today.  We also discussed Cologuard but he declines this.  He is overdue for Pneumovax 23.  He is overdue for Shingrix.  He declines all vaccinations today.  Blood pressure is elevated 159/78.  Denies chest pain shortness of breath or dyspnea exertion.  At that time, my plan was; Blood pressure today is too high.  Add hydrochlorothiazide  25 mg daily and recheck blood pressure in 1 month.  Check CBC CMP and lipid panel.  Goal LDL cholesterol is less than 100.  Recommended Pneumovax 23.  Recommended Shingrix.  Patient declines.  PSA sounds up-to-date.  Recommended colonoscopy or Cologuard.  Patient defers these at present time  09/15/23 Patient states that he is tolerating the hydrochlorothiazide  without difficulty.  He is checking his blood pressure at home and his systolic blood pressure is averaging between 120 and 130.  He denies any dizziness or lightheadedness.  He denies any history of gout.  He denies any chest pain or shortness of breath.  He continues to decline colon cancer screening.  We discussed this again today and I also mention Cologuard but he declines this.  He is also due for the shingles vaccine as well as the pneumonia shot.  I reminded him of these today but he declined them Past Medical History:  Diagnosis Date   Atypical chest pain    Glaucoma    Hyperlipidemia    Hypertension    Palpitation    Reflux esophagitis    No past surgical history on file. Current Outpatient Medications on File Prior to Visit  Medication Sig Dispense Refill   amLODipine   (NORVASC ) 10 MG tablet Take 1 tablet (10 mg total) by mouth at bedtime. 30 tablet 1   atorvastatin  (LIPITOR) 40 MG tablet Take 1 tablet (40 mg total) by mouth daily. 90 tablet 1   brimonidine (ALPHAGAN) 0.2 % ophthalmic solution SMARTSIG:In Eye(s)     Cholecalciferol (DIALYVITE VITAMIN D 5000 PO) Take 5,000 Units by mouth daily at 6 (six) AM.     dorzolamide-timolol (COSOPT) 2-0.5 % ophthalmic solution SMARTSIG:In Eye(s)     hydrochlorothiazide  (HYDRODIURIL ) 25 MG tablet Take 1 tablet (25 mg total) by mouth daily. 90 tablet 3   latanoprost (XALATAN) 0.005 % ophthalmic solution SMARTSIG:In Eye(s)     lisinopril  (ZESTRIL ) 40 MG tablet Take 1 tablet (40 mg total) by mouth daily. 90 tablet 0   triamcinolone  cream (KENALOG ) 0.1 % Apply 1 Application topically in the morning, at noon, and at bedtime. Apply 1 application topically in the morning, at noon, and at bedtime. 30 g 1   No current facility-administered medications on file prior to visit.   No Known Allergies Social History   Socioeconomic History   Marital status: Married    Spouse name: Not on file   Number of children: Not on file   Years of education: Not on file   Highest education level: High school graduate  Occupational History   Occupation: Full time    Employer: [employer]  Tobacco Use  Smoking status: Never   Smokeless tobacco: Never  Vaping Use   Vaping status: Never Used  Substance and Sexual Activity   Alcohol use: No   Drug use: No   Sexual activity: Yes    Birth control/protection: None  Other Topics Concern   Not on file  Social History Narrative   Married   No regular exercise   Social Drivers of Corporate investment banker Strain: Not on file  Food Insecurity: Not on file  Transportation Needs: Not on file  Physical Activity: Not on file  Stress: Not on file  Social Connections: Not on file  Intimate Partner Violence: Not on file   Family History  Problem Relation Age of Onset    Heart disease Mother       Review of Systems  All other systems reviewed and are negative.      Objective:   Physical Exam Vitals reviewed.  Constitutional:      Appearance: Normal appearance. He is normal weight.  HENT:     Mouth/Throat:     Mouth: Mucous membranes are moist.     Pharynx: Oropharynx is clear. No oropharyngeal exudate.  Eyes:     Extraocular Movements: Extraocular movements intact.     Conjunctiva/sclera: Conjunctivae normal.     Pupils: Pupils are equal, round, and reactive to light.  Neck:     Vascular: No carotid bruit.  Cardiovascular:     Rate and Rhythm: Normal rate and regular rhythm.     Pulses: Normal pulses.     Heart sounds: Normal heart sounds. No murmur heard.    No friction rub. No gallop.  Pulmonary:     Effort: Pulmonary effort is normal. No respiratory distress.     Breath sounds: Normal breath sounds. No wheezing, rhonchi or rales.  Abdominal:     General: Abdomen is flat. There is no distension.     Palpations: Abdomen is soft.     Tenderness: There is no abdominal tenderness. There is no guarding or rebound.  Musculoskeletal:     Right lower leg: No edema.     Left lower leg: No edema.  Lymphadenopathy:     Cervical: No cervical adenopathy.  Neurological:     General: No focal deficit present.     Mental Status: He is alert and oriented to person, place, and time. Mental status is at baseline.     Deep Tendon Reflexes: Reflexes normal.  Psychiatric:        Mood and Affect: Mood normal.        Behavior: Behavior normal.        Thought Content: Thought content normal.        Judgment: Judgment normal.           Assessment & Plan:  Benign essential HTN I reviewed his blood work with the patient.  Aside from his sugar being 101, the remainder of his lab work is outstanding.  Blood pressure today is acceptable and blood pressures at home are even better.  Continue lisinopril , amlodipine , and hydrochlorothiazide .  I continued  to encourage him to screen for colon cancer and I also recommend the pneumonia vaccine and the shingles vaccine but he politely declined them today

## 2023-10-05 ENCOUNTER — Other Ambulatory Visit: Payer: Self-pay | Admitting: Family Medicine

## 2023-10-20 ENCOUNTER — Encounter: Admitting: Family Medicine

## 2023-10-20 DIAGNOSIS — Z961 Presence of intraocular lens: Secondary | ICD-10-CM | POA: Diagnosis not present

## 2023-10-20 DIAGNOSIS — H18413 Arcus senilis, bilateral: Secondary | ICD-10-CM | POA: Diagnosis not present

## 2023-10-20 DIAGNOSIS — H401133 Primary open-angle glaucoma, bilateral, severe stage: Secondary | ICD-10-CM | POA: Diagnosis not present

## 2023-10-20 DIAGNOSIS — H25093 Other age-related incipient cataract, bilateral: Secondary | ICD-10-CM | POA: Diagnosis not present

## 2023-11-13 ENCOUNTER — Ambulatory Visit: Admitting: Family Medicine

## 2023-11-13 ENCOUNTER — Encounter: Payer: Self-pay | Admitting: Family Medicine

## 2023-11-13 VITALS — BP 132/84 | HR 56 | Temp 98.0°F | Ht 67.0 in | Wt 200.3 lb

## 2023-11-13 DIAGNOSIS — L309 Dermatitis, unspecified: Secondary | ICD-10-CM

## 2023-11-13 DIAGNOSIS — R011 Cardiac murmur, unspecified: Secondary | ICD-10-CM

## 2023-11-13 DIAGNOSIS — I10 Essential (primary) hypertension: Secondary | ICD-10-CM | POA: Diagnosis not present

## 2023-11-13 DIAGNOSIS — Z0001 Encounter for general adult medical examination with abnormal findings: Secondary | ICD-10-CM

## 2023-11-13 DIAGNOSIS — Z125 Encounter for screening for malignant neoplasm of prostate: Secondary | ICD-10-CM | POA: Diagnosis not present

## 2023-11-13 DIAGNOSIS — Z1211 Encounter for screening for malignant neoplasm of colon: Secondary | ICD-10-CM

## 2023-11-13 DIAGNOSIS — Z0000 Encounter for general adult medical examination without abnormal findings: Secondary | ICD-10-CM

## 2023-11-13 LAB — PSA: PSA: 0.54 ng/mL (ref <=4.00)

## 2023-11-13 MED ORDER — TRIAMCINOLONE ACETONIDE 0.1 % EX CREA: 1.0000 | TOPICAL_CREAM | Freq: Three times a day (TID) | CUTANEOUS | 1 refills | Status: DC

## 2023-11-13 MED ORDER — HYDROCHLOROTHIAZIDE 25 MG PO TABS: 25.0000 mg | ORAL_TABLET | Freq: Every day | ORAL | 3 refills | Status: DC

## 2023-11-13 NOTE — Progress Notes (Signed)
 Subjective:    Patient ID: Christopher Gallegos, male    DOB: [birth year], 67 y.o.   MRN: [id number]  Hypertension  08/13/23 Patient is here today to establish care.  Past medical history is significant for hypertension and hyperlipidemia.  He states that his prostate was checked less than a year ago by his previous PCP.  He states that he is due to to have a colonoscopy.  He has never had a colonoscopy.  He refuses a colonoscopy today.  We also discussed Cologuard but he declines this.  He is overdue for Pneumovax 23.  He is overdue for Shingrix.  He declines all vaccinations today.  Blood pressure is elevated 159/78.  Denies chest pain shortness of breath or dyspnea exertion.  At that time, my plan was; Blood pressure today is too high.  Add hydrochlorothiazide  25 mg daily and recheck blood pressure in 1 month.  Check CBC CMP and lipid panel.  Goal LDL cholesterol is less than 100.  Recommended Pneumovax 23.  Recommended Shingrix.  Patient declines.  PSA sounds up-to-date.  Recommended colonoscopy or Cologuard.  Patient defers these at present time  09/15/23 Patient states that he is tolerating the hydrochlorothiazide  without difficulty.  He is checking his blood pressure at home and his systolic blood pressure is averaging between 120 and 130.  He denies any dizziness or lightheadedness.  He denies any history of gout.  He denies any chest pain or shortness of breath.  He continues to decline colon cancer screening.  We discussed this again today and I also mention Cologuard but he declines this.  He is also due for the shingles vaccine as well as the pneumonia shot.  I reminded him of these today but he declined them  11/13/23 Patient is here today for a complete physical exam.  He is due for the pneumonia vaccine.  He is due for shingles vaccine.  He is due for a flu shot, tetanus shot, and a COVID shot.  He refuses all vaccinations.  He is overdue for colon cancer screening and I was able to convince  him to do Cologuard today.  He is due for prostate cancer screening and he is willing to get a PSA checked today.  I reviewed his lab work from earlier this summer which was outstanding.  He does have a murmur on exam today.  He denies any chest pain or shortness of breath. Past Medical History:  Diagnosis Date   Atypical chest pain    Glaucoma    Hyperlipidemia    Hypertension    Palpitation    Reflux esophagitis    No past surgical history on file. Current Outpatient Medications on File Prior to Visit  Medication Sig Dispense Refill   amLODipine  (NORVASC ) 10 MG tablet TAKE 1 TABLET BY MOUTH AT  BEDTIME 90 tablet 1   atorvastatin  (LIPITOR) 40 MG tablet Take 1 tablet (40 mg total) by mouth daily. 90 tablet 1   brimonidine (ALPHAGAN) 0.2 % ophthalmic solution SMARTSIG:In Eye(s)     Cholecalciferol (DIALYVITE VITAMIN D 5000 PO) Take 5,000 Units by mouth daily at 6 (six) AM.     dorzolamide-timolol (COSOPT) 2-0.5 % ophthalmic solution SMARTSIG:In Eye(s)     latanoprost (XALATAN) 0.005 % ophthalmic solution SMARTSIG:In Eye(s)     lisinopril  (ZESTRIL ) 40 MG tablet TAKE 1 TABLET BY MOUTH DAILY 90 tablet 3   No current facility-administered medications on file prior to visit.   No Known Allergies Social History   Socioeconomic  History   Marital status: Married    Spouse name: Not on file   Number of children: Not on file   Years of education: Not on file   Highest education level: High school graduate  Occupational History   Occupation: Full time    Employer: [employer]  Tobacco Use   Smoking status: Never   Smokeless tobacco: Never  Vaping Use   Vaping status: Never Used  Substance and Sexual Activity   Alcohol use: No   Drug use: No   Sexual activity: Yes    Birth control/protection: None  Other Topics Concern   Not on file  Social History Narrative   Married   No regular exercise   Social Drivers of Corporate investment banker Strain: Not on file  Food  Insecurity: Not on file  Transportation Needs: Not on file  Physical Activity: Not on file  Stress: Not on file  Social Connections: Not on file  Intimate Partner Violence: Not on file   Family History  Problem Relation Age of Onset   Heart disease Mother       Review of Systems  All other systems reviewed and are negative.      Objective:   Physical Exam Vitals reviewed.  Constitutional:      Appearance: Normal appearance. He is normal weight.  HENT:     Right Ear: Tympanic membrane and ear canal normal.     Left Ear: Tympanic membrane and ear canal normal.     Mouth/Throat:     Mouth: Mucous membranes are moist.     Pharynx: Oropharynx is clear. No oropharyngeal exudate.  Eyes:     Extraocular Movements: Extraocular movements intact.     Conjunctiva/sclera: Conjunctivae normal.     Pupils: Pupils are equal, round, and reactive to light.  Neck:     Vascular: No carotid bruit.  Cardiovascular:     Rate and Rhythm: Normal rate and regular rhythm.     Pulses: Normal pulses.     Heart sounds: Murmur heard.     No friction rub. No gallop.  Pulmonary:     Effort: Pulmonary effort is normal. No respiratory distress.     Breath sounds: Normal breath sounds. No wheezing, rhonchi or rales.  Abdominal:     General: Abdomen is flat. There is no distension.     Palpations: Abdomen is soft.     Tenderness: There is no abdominal tenderness. There is no guarding or rebound.  Musculoskeletal:     Right lower leg: No edema.     Left lower leg: No edema.  Lymphadenopathy:     Cervical: No cervical adenopathy.  Skin:    Findings: No erythema.  Neurological:     General: No focal deficit present.     Mental Status: He is alert and oriented to person, place, and time. Mental status is at baseline.     Cranial Nerves: No cranial nerve deficit.     Sensory: No sensory deficit.     Motor: No weakness.     Coordination: Coordination normal.     Deep Tendon Reflexes: Reflexes  normal.  Psychiatric:        Mood and Affect: Mood normal.        Behavior: Behavior normal.        Thought Content: Thought content normal.        Judgment: Judgment normal.           Assessment & Plan:  Benign  essential HTN - Plan: hydrochlorothiazide  (HYDRODIURIL ) 25 MG tablet  Eczema, unspecified type - Plan: triamcinolone  cream (KENALOG ) 0.1 %  Prostate cancer screening - Plan: PSA  Colon cancer screening - Plan: Cologuard  Murmur - Plan: ECHOCARDIOGRAM COMPLETE  General medical exam  Recommended a colonoscopy versus Cologuard.  He agrees to [employer].  I will order this for him.  Check a PSA to screen for prostate cancer.  Blood pressure is well-controlled at 132/84.  He refuses the pneumonia vaccine, shingles vaccine, a tetanus shot, flu shot, and COVID-vaccine.  I reviewed his CBC CMP and fasting lipid panel from the summer which was excellent.  I will schedule an echocardiogram to evaluate the murmur appreciated today on exam.

## 2023-11-16 ENCOUNTER — Ambulatory Visit: Payer: Self-pay | Admitting: Family Medicine

## 2023-11-16 ENCOUNTER — Other Ambulatory Visit: Payer: Self-pay

## 2023-11-16 DIAGNOSIS — L309 Dermatitis, unspecified: Secondary | ICD-10-CM

## 2023-11-16 DIAGNOSIS — I10 Essential (primary) hypertension: Secondary | ICD-10-CM

## 2023-11-16 MED ORDER — HYDROCHLOROTHIAZIDE 25 MG PO TABS: 25.0000 mg | ORAL_TABLET | Freq: Every day | ORAL | 3 refills | Status: AC

## 2023-11-16 MED ORDER — TRIAMCINOLONE ACETONIDE 0.1 % EX CREA: 1.0000 | TOPICAL_CREAM | Freq: Three times a day (TID) | CUTANEOUS | 1 refills | Status: AC

## 2023-11-25 DIAGNOSIS — H26491 Other secondary cataract, right eye: Secondary | ICD-10-CM | POA: Diagnosis not present

## 2023-11-30 ENCOUNTER — Ambulatory Visit (HOSPITAL_COMMUNITY): Admission: RE | Admit: 2023-11-30 | Source: Ambulatory Visit

## 2024-02-20 ENCOUNTER — Other Ambulatory Visit: Payer: Self-pay | Admitting: Family Medicine

## 2024-03-21 ENCOUNTER — Other Ambulatory Visit: Payer: Self-pay | Admitting: Family Medicine

## 2024-03-23 NOTE — Telephone Encounter (Signed)
 Requested Prescriptions  Pending Prescriptions Disp Refills   amLODipine  (NORVASC ) 10 MG tablet [Pharmacy Med Name: amLODIPine  Besylate 10 MG Oral Tablet] 90 tablet 2    Sig: TAKE 1 TABLET BY MOUTH AT  BEDTIME     Cardiovascular: Calcium  Channel Blockers 2 Passed - 03/23/2024  8:06 AM      Passed - Last BP in normal range    BP Readings from Last 1 Encounters:  11/13/23 132/84         Passed - Last Heart Rate in normal range    Pulse Readings from Last 1 Encounters:  11/13/23 (!) 56         Passed - Valid encounter within last 6 months    Recent Outpatient Visits           4 months ago Benign essential HTN   [place] [facility] Duanne Butler DASEN, MD   6 months ago Benign essential HTN   [place] [facility] Duanne Butler DASEN, MD   7 months ago Benign essential HTN   [place] [facility] Duanne, Butler DASEN, MD   1 year ago Hypertension, unspecified type   [place] [facility] Kayla Jeoffrey RAMAN, FNP   1 year ago Hypertension, unspecified type   [place] [facility] Kayla Jeoffrey RAMAN, FNP

## 2024-11-15 ENCOUNTER — Encounter: Admitting: Family Medicine
